# Patient Record
Sex: Female | Born: 2000 | Hispanic: No | Marital: Single | Smoking: Former smoker
Health system: Southern US, Community
[De-identification: ages and names within clinical notes are randomized; demographics above are authoritative.]

## PROBLEM LIST (undated history)

## (undated) ENCOUNTER — Ambulatory Visit

## (undated) DIAGNOSIS — Z3402 Encounter for supervision of normal first pregnancy, second trimester: Secondary | ICD-10-CM

## (undated) DIAGNOSIS — F419 Anxiety disorder, unspecified: Secondary | ICD-10-CM

---
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
## 2019-01-05 ENCOUNTER — Ambulatory Visit (INDEPENDENT_AMBULATORY_CARE_PROVIDER_SITE_OTHER): Payer: Medicaid Other | Admitting: Obstetrics

## 2019-01-05 ENCOUNTER — Encounter: Payer: Self-pay | Admitting: Obstetrics

## 2019-01-05 DIAGNOSIS — Z3A34 34 weeks gestation of pregnancy: Secondary | ICD-10-CM

## 2019-01-05 DIAGNOSIS — Z3480 Encounter for supervision of other normal pregnancy, unspecified trimester: Secondary | ICD-10-CM

## 2019-01-05 DIAGNOSIS — Z3483 Encounter for supervision of other normal pregnancy, third trimester: Secondary | ICD-10-CM

## 2019-01-05 NOTE — Progress Notes (Signed)
   TELEHEALTH OBSTETRICS PRENATAL VIRTUAL VIDEO VISIT ENCOUNTER NOTE  Provider location: [facility] [place] [facility] [place]   I connected with Rickey Primus on 01/05/19 at 11:15 AM EDT by WebEx OB MyChart Video Encounter at home and verified that I am speaking with the correct person using two identifiers.   I discussed the limitations, risks, security and privacy concerns of performing an evaluation and management service by telephone and the availability of in person appointments. I also discussed with the patient that there may be a patient responsible charge related to this service. The patient expressed understanding and agreed to proceed. Subjective:  Charlotte Ward is a 18 y.o. G1P0 at [redacted]w[redacted]d being seen today for ongoing prenatal care.  She is currently monitored for the following issues for this low-risk pregnancy and has Supervision of normal first teen pregnancy in second trimester on their problem list.  Patient reports heartburn.  Contractions: Irritability. Vag. Bleeding: None.  Movement: Present. Denies any leaking of fluid.   The following portions of the patient's history were reviewed and updated as appropriate: allergies, current medications, past family history, past medical history, past social history, past surgical history and problem list.   Objective:  There were no vitals filed for this visit.  Fetal Status:     Movement: Present     General:  Alert, oriented and cooperative. Patient is in no acute distress.  Respiratory: Normal respiratory effort, no problems with respiration noted  Mental Status: Normal mood and affect. Normal behavior. Normal judgment and thought content.  Rest of physical exam deferred due to type of encounter  Imaging: No results found.  Assessment and Plan:  Pregnancy: G1P0 at [redacted]w[redacted]d 1. Supervision of other normal pregnancy, antepartum   Preterm labor symptoms and general obstetric precautions including but not limited to vaginal  bleeding, contractions, leaking of fluid and fetal movement were reviewed in detail with the patient. I discussed the assessment and treatment plan with the patient. The patient was provided an opportunity to ask questions and all were answered. The patient agreed with the plan and demonstrated an understanding of the instructions. The patient was advised to call back or seek an in-person office evaluation/go to [facility] at [facility] for any urgent or concerning symptoms. Please refer to After Visit Summary for other counseling recommendations.   I provided 10 minutes of face-to-face time during this encounter.  Return in about 2 weeks (around 01/19/2019) for [place] in office.  No future appointments.  Baltazar Najjar, MD [facility] for [facility] [place] [facility] 01-05-2019

## 2019-01-05 NOTE — Progress Notes (Signed)
CC: None  

## 2019-01-06 ENCOUNTER — Encounter: Payer: Medicaid Other | Admitting: Obstetrics

## 2019-01-19 ENCOUNTER — Other Ambulatory Visit (HOSPITAL_COMMUNITY)
Admission: RE | Admit: 2019-01-19 | Discharge: 2019-01-19 | Disposition: A | Discharge status: Home / Self Care | Payer: Medicaid Other | Source: Ambulatory Visit | Attending: Certified Nurse Midwife | Admitting: Certified Nurse Midwife

## 2019-01-19 ENCOUNTER — Other Ambulatory Visit: Payer: Self-pay

## 2019-01-19 ENCOUNTER — Encounter: Payer: Self-pay | Admitting: Certified Nurse Midwife

## 2019-01-19 ENCOUNTER — Ambulatory Visit (INDEPENDENT_AMBULATORY_CARE_PROVIDER_SITE_OTHER): Payer: Medicaid Other | Admitting: Certified Nurse Midwife

## 2019-01-19 VITALS — BP 113/69 | HR 76 | Wt 150.0 lb

## 2019-01-19 DIAGNOSIS — Z3A36 36 weeks gestation of pregnancy: Secondary | ICD-10-CM

## 2019-01-19 DIAGNOSIS — Z3403 Encounter for supervision of normal first pregnancy, third trimester: Secondary | ICD-10-CM

## 2019-01-19 DIAGNOSIS — Z3480 Encounter for supervision of other normal pregnancy, unspecified trimester: Secondary | ICD-10-CM | POA: Insufficient documentation

## 2019-01-19 DIAGNOSIS — Z3402 Encounter for supervision of normal first pregnancy, second trimester: Secondary | ICD-10-CM

## 2019-01-19 NOTE — Progress Notes (Signed)
   PRENATAL VISIT NOTE  Subjective:  Charlotte Ward is a 18 y.o. G1P0 at [redacted]w[redacted]d being seen today for ongoing prenatal care.  She is currently monitored for the following issues for this low-risk pregnancy and has Supervision of normal first teen pregnancy in second trimester on their problem list.  Patient reports vaginal discharge.  Contractions: Irritability. Vag. Bleeding: None.  Movement: Present. Denies leaking of fluid.   The following portions of the patient's history were reviewed and updated as appropriate: allergies, current medications, past family history, past medical history, past social history, past surgical history and problem list.   Objective:   Vitals:   01/19/19 1109  BP: 113/69  Pulse: 76  Weight: 150 lb (68 kg)    Fetal Status: Fetal Heart Rate (bpm): 140 Fundal Height: 32 cm Movement: Present  Presentation: Vertex  General:  Alert, oriented and cooperative. Patient is in no acute distress.  Skin: Skin is warm and dry. No rash noted.   Cardiovascular: Normal heart rate noted  Respiratory: Normal respiratory effort, no problems with respiration noted  Abdomen: Soft, gravid, appropriate for gestational age.  Pain/Pressure: Absent     Pelvic: Cervical exam performed Dilation: 1 Effacement (%): 50 Station: -2  Extremities: Normal range of motion.  Edema: Trace  Mental Status: Normal mood and affect. Normal behavior. Normal judgment and thought content.   Assessment and Plan:  Pregnancy: G1P0 at [redacted]w[redacted]d 1. Supervision of normal first teen pregnancy in second trimester - Patient reports vaginal discharge has been present for the past week, describes discharge as white thick without odor, denies itching or irritation, swabs obtained will manage accordingly  - Routine prenatal care - Labor precautions and reasons to go to MAU reviewed  - Anticipatory guidance on upcoming appointments with next being MyChart visit then in person at 39 weeks, patient verbalizes  understanding  - Strep Gp B NAA - Cervicovaginal ancillary only( [place])  Preterm labor symptoms and general obstetric precautions including but not limited to vaginal bleeding, contractions, leaking of fluid and fetal movement were reviewed in detail with the patient. Please refer to After Visit Summary for other counseling recommendations.   Return in about 2 weeks (around 02/02/2019) for ROB-mychart.  Future Appointments  Date Time Provider [place]  02/02/2019  3:15 PM Glenice Bow, DO [place] None    Charlotte Ward, [place]

## 2019-01-19 NOTE — Progress Notes (Signed)
CC: Vaginal discharge.

## 2019-01-19 NOTE — Patient Instructions (Signed)
Reasons to go to MAU:  1.  Contractions are  5 minutes apart or less, each last 1 minute, these have been going on for 1-2 hours, and you cannot walk or talk during them 2.  You have a large gush of fluid, or a trickle of fluid that will not stop and you have to wear a pad 3.  You have bleeding that is bright red, heavier than spotting--like menstrual bleeding (spotting can be normal in early labor or after a check of your cervix) 4.  You do not feel the baby moving like he/she normally does   Third Trimester of Pregnancy  The third trimester is from week 28 through week 40 (months 7 through 9). This trimester is when your unborn baby (fetus) is growing very fast. At the end of the ninth month, the unborn baby is about 20 inches in length. It weighs about 6-10 pounds. Follow these instructions at home: Medicines  Take over-the-counter and prescription medicines only as told by your doctor. Some medicines are safe and some medicines are not safe during pregnancy.  Take a prenatal vitamin that contains at least 600 micrograms (mcg) of folic acid.  If you have trouble pooping (constipation), take medicine that will make your stool soft (stool softener) if your doctor approves. Eating and drinking   Eat regular, healthy meals.  Avoid raw meat and uncooked cheese.  If you get low calcium from the food you eat, talk to your doctor about taking a daily calcium supplement.  Eat four or five small meals rather than three large meals a day.  Avoid foods that are high in fat and sugars, such as fried and sweet foods.  To prevent constipation: ? Eat foods that are high in fiber, like fresh fruits and vegetables, whole grains, and beans. ? Drink enough fluids to keep your pee (urine) clear or pale yellow. Activity  Exercise only as told by your doctor. Stop exercising if you start to have cramps.  Avoid heavy lifting, wear low heels, and sit up straight.  Do not exercise if it is too hot,  too humid, or if you are in a place of great height (high altitude).  You may continue to have sex unless your doctor tells you not to. Relieving pain and discomfort  Wear a good support bra if your breasts are tender.  Take frequent breaks and rest with your legs raised if you have leg cramps or low back pain.  Take warm water baths (sitz baths) to soothe pain or discomfort caused by hemorrhoids. Use hemorrhoid cream if your doctor approves.  If you develop puffy, bulging veins (varicose veins) in your legs: ? Wear support hose or compression stockings as told by your doctor. ? Raise (elevate) your feet for 15 minutes, 3-4 times a day. ? Limit salt in your food. Safety  Wear your seat belt when driving.  Make a list of emergency phone numbers, including numbers for family, friends, the hospital, and police and fire departments. Preparing for your baby's arrival To prepare for the arrival of your baby:  Take prenatal classes.  Practice driving to the hospital.  Visit the hospital and tour the maternity area.  Talk to your work about taking leave once the baby comes.  Pack your hospital bag.  Prepare the baby's room.  Go to your doctor visits.  Buy a rear-facing car seat. Learn how to install it in your car. General instructions  Do not use hot tubs, steam rooms, or  saunas.  Do not use any products that contain nicotine or tobacco, such as cigarettes and e-cigarettes. If you need help quitting, ask your doctor.  Do not drink alcohol.  Do not douche or use tampons or scented sanitary pads.  Do not cross your legs for long periods of time.  Do not travel for long distances unless you must. Only do so if your doctor says it is okay.  Visit your dentist if you have not gone during your pregnancy. Use a soft toothbrush to brush your teeth. Be gentle when you floss.  Avoid cat litter boxes and soil used by cats. These carry germs that can cause birth defects in the baby  and can cause a loss of your baby (miscarriage) or stillbirth.  Keep all your prenatal visits as told by your doctor. This is important. Contact a doctor if:  You are not sure if you are in labor or if your water has broken.  You are dizzy.  You have mild cramps or pressure in your lower belly.  You have a nagging pain in your belly area.  You continue to feel sick to your stomach, you throw up, or you have watery poop.  You have bad smelling fluid coming from your vagina.  You have pain when you pee. Get help right away if:  You have a fever.  You are leaking fluid from your vagina.  You are spotting or bleeding from your vagina.  You have severe belly cramps or pain.  You lose or gain weight quickly.  You have trouble catching your breath and have chest pain.  You notice sudden or extreme puffiness (swelling) of your face, hands, ankles, feet, or legs.  You have not felt the baby move in over an hour.  You have severe headaches that do not go away with medicine.  You have trouble seeing.  You are leaking, or you are having a gush of fluid, from your vagina before you are 37 weeks.  You have regular belly spasms (contractions) before you are 37 weeks. Summary  The third trimester is from week 28 through week 40 (months 7 through 9). This time is when your unborn baby is growing very fast.  Follow your doctor's advice about medicine, food, and activity.  Get ready for the arrival of your baby by taking prenatal classes, getting all the baby items ready, preparing the baby's room, and visiting your doctor to be checked.  Get help right away if you are bleeding from your vagina, or you have chest pain and trouble catching your breath, or if you have not felt your baby move in over an hour. This information is not intended to replace advice given to you by your health care provider. Make sure you discuss any questions you have with your health care provider. Document  Released: 10/07/2009 Document Revised: 08/18/2016 Document Reviewed: 08/18/2016 Elsevier Interactive Patient Education  2019 [employer].

## 2019-01-20 LAB — CERVICOVAGINAL ANCILLARY ONLY
Bacterial vaginitis: NEGATIVE
Candida vaginitis: POSITIVE — AB
Chlamydia: NEGATIVE
Neisseria Gonorrhea: NEGATIVE
Trichomonas: NEGATIVE

## 2019-01-21 ENCOUNTER — Other Ambulatory Visit: Payer: Self-pay

## 2019-01-21 ENCOUNTER — Encounter (HOSPITAL_COMMUNITY): Payer: Self-pay | Admitting: *Deleted

## 2019-01-21 ENCOUNTER — Inpatient Hospital Stay (HOSPITAL_COMMUNITY)
Admission: AD | Admit: 2019-01-21 | Discharge: 2019-01-23 | DRG: 807 | Disposition: A | Discharge status: Home / Self Care | Payer: 59 | Attending: Family Medicine | Admitting: Family Medicine

## 2019-01-21 DIAGNOSIS — O4290 Premature rupture of membranes, unspecified as to length of time between rupture and onset of labor, unspecified weeks of gestation: Secondary | ICD-10-CM | POA: Diagnosis present

## 2019-01-21 DIAGNOSIS — Z3A36 36 weeks gestation of pregnancy: Secondary | ICD-10-CM | POA: Diagnosis not present

## 2019-01-21 DIAGNOSIS — Z3402 Encounter for supervision of normal first pregnancy, second trimester: Secondary | ICD-10-CM

## 2019-01-21 DIAGNOSIS — O42913 Preterm premature rupture of membranes, unspecified as to length of time between rupture and onset of labor, third trimester: Secondary | ICD-10-CM | POA: Diagnosis present

## 2019-01-21 DIAGNOSIS — Z1159 Encounter for screening for other viral diseases: Secondary | ICD-10-CM

## 2019-01-21 DIAGNOSIS — O09299 Supervision of pregnancy with other poor reproductive or obstetric history, unspecified trimester: Secondary | ICD-10-CM | POA: Diagnosis present

## 2019-01-21 DIAGNOSIS — O42013 Preterm premature rupture of membranes, onset of labor within 24 hours of rupture, third trimester: Secondary | ICD-10-CM | POA: Diagnosis not present

## 2019-01-21 LAB — CBC
HCT: 35.5 % — ABNORMAL LOW (ref 36.0–46.0)
Hemoglobin: 12.1 g/dL (ref 12.0–15.0)
MCH: 32.2 pg (ref 26.0–34.0)
MCHC: 34.1 g/dL (ref 30.0–36.0)
MCV: 94.4 fL (ref 80.0–100.0)
Platelets: 349 10*3/uL (ref 150–400)
RBC: 3.76 MIL/uL — ABNORMAL LOW (ref 3.87–5.11)
RDW: 12.8 % (ref 11.5–15.5)
WBC: 10.5 10*3/uL (ref 4.0–10.5)
nRBC: 0 % (ref 0.0–0.2)

## 2019-01-21 LAB — POCT FERN TEST: POCT Fern Test: POSITIVE

## 2019-01-21 LAB — TYPE AND SCREEN
ABO/RH(D): O POS
Antibody Screen: NEGATIVE

## 2019-01-21 LAB — SARS CORONAVIRUS 2 BY RT PCR (HOSPITAL ORDER, PERFORMED IN ~~LOC~~ HOSPITAL LAB): SARS Coronavirus 2: NEGATIVE

## 2019-01-21 LAB — STREP GP B NAA: Strep Gp B NAA: NEGATIVE

## 2019-01-21 MED ORDER — FENTANYL CITRATE (PF) 100 MCG/2ML IJ SOLN
100.0000 ug | INTRAMUSCULAR | Status: DC | PRN
  Administered 2019-01-22: 02:00:00 100 ug via INTRAVENOUS
  Filled 2019-01-21: qty 2

## 2019-01-21 MED ORDER — MISOPROSTOL 50MCG HALF TABLET
ORAL_TABLET | ORAL | Status: AC
  Filled 2019-01-21: qty 1

## 2019-01-21 MED ORDER — LACTATED RINGERS IV SOLN
INTRAVENOUS | Status: DC
  Administered 2019-01-21 (×2): via INTRAVENOUS

## 2019-01-21 MED ORDER — LIDOCAINE HCL (PF) 1 % IJ SOLN
30.0000 mL | INTRAMUSCULAR | Status: DC | PRN
  Filled 2019-01-21: qty 30

## 2019-01-21 MED ORDER — OXYTOCIN BOLUS FROM INFUSION: 500.0000 mL | Freq: Once | INTRAVENOUS | Status: DC

## 2019-01-21 MED ORDER — OXYCODONE-ACETAMINOPHEN 5-325 MG PO TABS: 2.0000 | ORAL_TABLET | ORAL | Status: DC | PRN

## 2019-01-21 MED ORDER — OXYCODONE-ACETAMINOPHEN 5-325 MG PO TABS: 1.0000 | ORAL_TABLET | ORAL | Status: DC | PRN

## 2019-01-21 MED ORDER — MISOPROSTOL 50MCG HALF TABLET
50.0000 ug | ORAL_TABLET | ORAL | Status: DC
  Administered 2019-01-21: 17:00:00 50 ug via ORAL

## 2019-01-21 MED ORDER — SOD CITRATE-CITRIC ACID 500-334 MG/5ML PO SOLN: 30.0000 mL | ORAL | Status: DC | PRN

## 2019-01-21 MED ORDER — FLEET ENEMA 7-19 GM/118ML RE ENEM: 1.0000 | ENEMA | RECTAL | Status: DC | PRN

## 2019-01-21 MED ORDER — ACETAMINOPHEN 325 MG PO TABS: 650.0000 mg | ORAL_TABLET | ORAL | Status: DC | PRN

## 2019-01-21 MED ORDER — LACTATED RINGERS IV SOLN: 500.0000 mL | INTRAVENOUS | Status: DC | PRN

## 2019-01-21 MED ORDER — ONDANSETRON HCL 4 MG/2ML IJ SOLN: 4.0000 mg | Freq: Four times a day (QID) | INTRAMUSCULAR | Status: DC | PRN

## 2019-01-21 MED ORDER — OXYTOCIN 40 UNITS IN NORMAL SALINE INFUSION - SIMPLE MED
2.5000 [IU]/h | INTRAVENOUS | Status: DC
  Filled 2019-01-21: qty 1000

## 2019-01-21 NOTE — Progress Notes (Signed)
Vtx confirmed by bedside US 

## 2019-01-21 NOTE — MAU Note (Signed)
Charlotte Ward is a 18 y.o. at [redacted]w[redacted]d here in MAU reporting: states this morning after she used the restroom, water started coming out and wouldn't stop. States she is still feeling the leaking, had some paper towels in her panties. Fluid was clear. No pain, no bleeding. +FM  Onset of complaint: an hour ago  Pain score: 0/10  Vitals:   01/21/19 1122  BP: 117/77  Pulse: 74  Resp: 16  Temp: 98.2 F (36.8 C)  SpO2: 97%     FHT: +FM  Lab orders placed from triage: none

## 2019-01-21 NOTE — Progress Notes (Signed)
[facility]: Labor Progress Note  Subjective:  Patient doing well.  She reports strengthening contractions that she is just starting to feel.  Objective:  BP 139/72   Pulse 67   Temp 100 F (37.8 C) (Oral)   Resp 18   Ht 5\' 7"  (1.702 m)   Wt 68.7 kg   LMP 05/09/2018   SpO2 97%   BMI 23.72 kg/m  Gen: Lying in bed comfortably. NAD.  Extremities: No signs of DVT.   CE: Dilation: 1 Effacement (%): 50 Station: -2 Presentation: Vertex Exam by:: Dr. Maudie Mercury (resident) Contractions: regular q32minutes FH: BL 130, mod var, + a, -d.   Assessment and Plan:  Charlotte Ward is a 18 y.o. G1P0 at [redacted]w[redacted]d - PPROM  Labor: No cervical change but contractions strengthening. Placed FB successfully. No cytotec.  . Pain control: IV pain meds and contemplating epidural . Anticipated MOD: NSVD . PPH Risk: low   Fetal Wellbeing: Cat I tracing . GBS Negative (06/25 1144)  . Continuous fetal monitoring  Zettie Cooley, M.D.  Family Medicine  PGY-1 01/21/2019 10:12 PM

## 2019-01-21 NOTE — Progress Notes (Signed)
Labor Progress Note Jahzaria Vary is a 18 y.o. G1P0 at [redacted]w[redacted]d presented for PPROM  S:  Patient states cramping is the same as before. No worse.   O:  BP 124/70   Pulse 70   Temp 98.2 F (36.8 C) (Oral)   Resp 16   Ht 5\' 7"  (1.702 m)   Wt 68.7 kg   LMP 05/09/2018   SpO2 97%   BMI 23.72 kg/m   Fetal Tracing:  Baseline: 135 Variability: moderate Accels: 15x15 Decels: none  Toco: ui   CVE: Dilation: 1 Effacement (%): 50 Station: -2 Presentation: Vertex Exam by:: Len Blalock, CNM   A&P: 18 y.o. G1P0 [redacted]w[redacted]d PPROM #Labor: No cervical change and no worsening contractions. Discussed with patient cytotec with FB for augmentation. Patient declines FB at this time but agreeable to try cytotec #Pain: n/a #FWB: Cat 1 #GBS negative  Wende Mott, CNM 4:58 PM

## 2019-01-21 NOTE — H&P (Addendum)
OBSTETRIC ADMISSION HISTORY AND PHYSICAL  Charlotte Ward is a 18 y.o. female G1P0 with IUP at 4056w5d by LMP presenting for PPROM. She reports a gush of clear fluid at 1000 that has continued to leak. She reports +FMs, no VB, no blurry vision, headaches or peripheral edema, and RUQ pain.  She plans on breast feeding. She request PostPlacental IUD for birth control. [facility]   Dating: By LMP --->  Estimated Date of Delivery: 02/13/19   Nursing Staff Provider  Office Location  [facility] Dating   LMP: 05/09/2018  Language   English Anatomy US  Wnl  Flu Vaccine   09/26/2018 Genetic Screen  NIPS: low risk female   AFP:    TDaP vaccine   info given 11-21-18 Hgb A1C or  GTT Early  Third trimester : 2 hour wnl Component     Latest Ref Rng & Units 11/21/2018  Glucose, Fasting     65 - 91 mg/dL 71  Glucose, 1 hour     65 - 179 mg/dL 98  Glucose, 2 hour     65 - 152 mg/dL 74    Rhogam     LAB RESULTS   Feeding Plan  Breast Blood Type   O positive  Contraception  IUD Antibody  negative  Circumcision  Yes Rubella  immune  Pediatrician   [facility] [place] RPR   neg  Support Person  [facility] PagesBobby King - FOB HBsAg   neg  Prenatal Classes  Yes HIV    BTL Consent  GBS  (For PCN allergy, check sensitivities)   VBAC Consent  Pap n/a    Hgb Electro      CF     SMA     Waterbirth  [ ]  Class [ ]  Consent [ ]  CNM visit      Prenatal History/Complications:  Past Medical History: Past Medical History:  Diagnosis Date  . Anxiety     Past Surgical History: History reviewed. No pertinent surgical history.  Obstetrical History: OB History    Gravida  1   Para      Term      Preterm      AB      Living        SAB      TAB      Ectopic      Multiple      Live Births              Social History: Social History   Socioeconomic History  . Marital status: Single    Spouse name: Not on file  . Number of children: Not on file  . Years of  education: Not on file  . Highest education level: Not on file  Occupational History  . Not on file  Social Needs  . Financial resource strain: Not on file  . Food insecurity    Worry: Not on file    Inability: Not on file  . Transportation needs    Medical: Not on file    Non-medical: Not on file  Tobacco Use  . Smoking status: Former Smoker    Packs/day: 0.25    Types: Cigars    Quit date: 10/08/2016    Years since quitting: 2.2  . Smokeless tobacco: Never Used  Substance and Sexual Activity  . Alcohol use: No  . Drug use: No  . Sexual activity: Yes    Birth control/protection: None  Lifestyle  .  Physical activity    Days per week: Not on file    Minutes per session: Not on file  . Stress: Not on file  Relationships  . Social Herbalist on phone: Not on file    Gets together: Not on file    Attends religious service: Not on file    Active member of club or organization: Not on file    Attends meetings of clubs or organizations: Not on file    Relationship status: Not on file  Other Topics Concern  . Not on file  Social History Narrative  . Not on file    Family History: Family History  Problem Relation Age of Onset  . Asthma Mother   . Migraines Mother     Allergies: Allergies  Allergen Reactions  . Tomato Swelling    Patient reports only allergic to RAW tomatoes.    Medications Prior to Admission  Medication Sig Dispense Refill Last Dose  . Prenatal Vit-Fe Fumarate-FA (PRENATAL VITAMINS PO) Take by mouth.   01/20/2019 at Unknown time  . triamcinolone ointment (KENALOG) 0.5 % Apply 1 application topically 2 (two) times daily. 30 g 0      Review of Systems   All systems reviewed and negative except as stated in HPI  Blood pressure 113/66, pulse 82, temperature 98.2 F (36.8 C), temperature source Oral, resp. rate 16, height 5\' 7"  (1.702 m), weight 68.7 kg, last menstrual period 05/09/2018, SpO2 97 %. General appearance: alert, cooperative  and no distress Lungs: clear to auscultation bilaterally Heart: regular rate and rhythm Abdomen: soft, non-tender; bowel sounds normal Pelvic: n/a Extremities: Homans sign is negative, no sign of DVT DTR's +2 Presentation: cephalic Fetal monitoringBaseline: 140 bpm, Variability: Good {> 6 bpm), Accelerations: Reactive and Decelerations: Absent Uterine activity: UI     Prenatal labs: ABO, Rh: O/Positive/-- (03/02 1438) Antibody: Negative (03/02 1438) Rubella: 13.70 (03/02 1438) RPR: Non Reactive (04/27 1059)  HBsAg: Negative (03/02 1438)  HIV: Non Reactive (04/27 1059)  GBS: Negative (06/25 1144)   Prenatal Transfer Tool  Maternal Diabetes: No Genetic Screening: Normal Maternal Ultrasounds/Referrals: Normal Fetal Ultrasounds or other Referrals:  None Maternal Substance Abuse:  No Significant Maternal Medications:  None Significant Maternal Lab Results: Group B Strep negative  Results for orders placed or performed during the hospital encounter of 01/21/19 (from the past 24 hour(s))  POCT fern test   Collection Time: 01/21/19 12:06 PM  Result Value Ref Range   POCT Fern Test Positive = ruptured amniotic membanes     Patient Active Problem List   Diagnosis Date Noted  . Supervision of normal first teen pregnancy in second trimester 09/26/2018    Assessment/Plan:  Charlotte Ward is a 18 y.o. G1P0 at [redacted]w[redacted]d here for preterm premature rupture of membranes  #Labor: patient desires expectant management. Will recheck around 5pm.  #Pain: Per patient request #FWB: Cat 1 #ID:  GBS neg #MOF: Breast #MOC: now unsure about postplacental IUD, considering and will inform staff of plan #Circ:  [place], CNM  01/21/2019, 12:22 PM

## 2019-01-22 ENCOUNTER — Encounter (HOSPITAL_COMMUNITY): Payer: Self-pay

## 2019-01-22 DIAGNOSIS — O42013 Preterm premature rupture of membranes, onset of labor within 24 hours of rupture, third trimester: Secondary | ICD-10-CM

## 2019-01-22 DIAGNOSIS — Z3A36 36 weeks gestation of pregnancy: Secondary | ICD-10-CM

## 2019-01-22 LAB — CBC
HCT: 34.4 % — ABNORMAL LOW (ref 36.0–46.0)
Hemoglobin: 11.7 g/dL — ABNORMAL LOW (ref 12.0–15.0)
MCH: 32 pg (ref 26.0–34.0)
MCHC: 34 g/dL (ref 30.0–36.0)
MCV: 94 fL (ref 80.0–100.0)
Platelets: 334 10*3/uL (ref 150–400)
RBC: 3.66 MIL/uL — ABNORMAL LOW (ref 3.87–5.11)
RDW: 12.5 % (ref 11.5–15.5)
WBC: 19.4 10*3/uL — ABNORMAL HIGH (ref 4.0–10.5)
nRBC: 0 % (ref 0.0–0.2)

## 2019-01-22 LAB — RPR: RPR Ser Ql: NONREACTIVE

## 2019-01-22 MED ORDER — BENZOCAINE-MENTHOL 20-0.5 % EX AERO
1.0000 "application " | INHALATION_SPRAY | CUTANEOUS | Status: DC | PRN
  Administered 2019-01-22: 1 via TOPICAL
  Filled 2019-01-22: qty 56

## 2019-01-22 MED ORDER — SENNOSIDES-DOCUSATE SODIUM 8.6-50 MG PO TABS
2.0000 | ORAL_TABLET | ORAL | Status: DC
  Administered 2019-01-23: 2 via ORAL
  Filled 2019-01-22: qty 2

## 2019-01-22 MED ORDER — DIBUCAINE (PERIANAL) 1 % EX OINT: 1.0000 "application " | TOPICAL_OINTMENT | CUTANEOUS | Status: DC | PRN

## 2019-01-22 MED ORDER — ONDANSETRON HCL 4 MG PO TABS: 4.0000 mg | ORAL_TABLET | ORAL | Status: DC | PRN

## 2019-01-22 MED ORDER — WITCH HAZEL-GLYCERIN EX PADS: 1.0000 "application " | MEDICATED_PAD | CUTANEOUS | Status: DC | PRN

## 2019-01-22 MED ORDER — DIPHENHYDRAMINE HCL 25 MG PO CAPS: 25.0000 mg | ORAL_CAPSULE | Freq: Four times a day (QID) | ORAL | Status: DC | PRN

## 2019-01-22 MED ORDER — IBUPROFEN 600 MG PO TABS
600.0000 mg | ORAL_TABLET | Freq: Four times a day (QID) | ORAL | Status: DC
  Administered 2019-01-22 – 2019-01-23 (×6): 600 mg via ORAL
  Filled 2019-01-22 (×6): qty 1

## 2019-01-22 MED ORDER — DOCUSATE SODIUM 100 MG PO CAPS
100.0000 mg | ORAL_CAPSULE | Freq: Two times a day (BID) | ORAL | Status: DC
  Administered 2019-01-23 (×2): 100 mg via ORAL
  Filled 2019-01-22 (×2): qty 1

## 2019-01-22 MED ORDER — OXYTOCIN 10 UNIT/ML IJ SOLN
INTRAMUSCULAR | Status: AC
  Administered 2019-01-22: 10 [IU]
  Filled 2019-01-22: qty 1

## 2019-01-22 MED ORDER — SIMETHICONE 80 MG PO CHEW: 80.0000 mg | CHEWABLE_TABLET | ORAL | Status: DC | PRN

## 2019-01-22 MED ORDER — ACETAMINOPHEN 325 MG PO TABS
650.0000 mg | ORAL_TABLET | ORAL | Status: DC | PRN
  Administered 2019-01-22 (×2): 650 mg via ORAL
  Filled 2019-01-22 (×2): qty 2

## 2019-01-22 MED ORDER — COCONUT OIL OIL: 1.0000 "application " | TOPICAL_OIL | Status: DC | PRN

## 2019-01-22 MED ORDER — ONDANSETRON HCL 4 MG/2ML IJ SOLN: 4.0000 mg | INTRAMUSCULAR | Status: DC | PRN

## 2019-01-22 MED ORDER — PRENATAL MULTIVITAMIN CH
1.0000 | ORAL_TABLET | Freq: Every day | ORAL | Status: DC
  Administered 2019-01-23: 12:00:00 1 via ORAL
  Filled 2019-01-22: qty 1

## 2019-01-22 NOTE — Progress Notes (Signed)
CSW received consult for hx of Anxiety.  CSW met with MOB to offer support and complete assessment.    CSW met with MOB at bedside to discuss consult for history of anxiety. MOB was sitting up in bed and infant was asleep in basinet. CSW introduced self and explained reason for consult. MOB was welcoming and engaged during assessment. CSW and MOB discussed MOB's mental health history. MOB reported that she was diagnosed with anxiety in middle school and the last time she experienced symptoms was one month ago. MOB reported "nothing serious" and endorsed feelings of being overwhelmed about getting ready for infant' birth. CSW acknowledged and normalized MOB's feelings. MOB denied any current symptoms of anxiety. MOB denied any other mental health history. CSW inquired about MOB's support system, MOB reported that her boyfriend, mom, mom in law and her grandma were her supports. MOB presented calm and did not demonstrate any acute mental health signs/symptoms. CSW assessed for safety, MOB denied SI, HI and domestic violence.   CSW provided education regarding the baby blues period vs. perinatal mood disorders, discussed treatment and gave resources for mental health follow up if concerns arise.  CSW recommends self-evaluation during the postpartum time period using the New Mom Checklist from Postpartum Progress and encouraged MOB to contact a medical professional if symptoms are noted at any time.    CSW provided review of Sudden Infant Death Syndrome (SIDS) precautions. MOB verbalized understanding and reported that infant would sleep in basinet. MOB reported that she has all items needed to care for infant.    CSW identifies no further need for intervention and no barriers to discharge at this time.  Rishaan Gunner, LCSW Clinical Social Worker [facility] Cell#: [phone]   

## 2019-01-22 NOTE — Progress Notes (Signed)
Reinforced education on putting electric pump parts together.

## 2019-01-22 NOTE — Lactation Note (Signed)
This note was copied from a baby's chart. Lactation Consultation Note  Patient Name: Charlotte Ward INOMV'E Date: 01/22/2019 Reason for consult: Initial assessment;Primapara;1st time breastfeeding;Late-preterm 34-36.6wks Baby is 12 hours old , greater than 6 pounds , LPT .  Per mom has pumped and has EBM sitting at beside.  LC offered to check diaper, it was dry and woke baby up enough and spoon fed 2 ml and  Baby rooting and reviewed basics of latching with mom and baby latched well on the right breast / football  And fed for 8 mins and released on her own , nipple well rounded and per mom comfortable.  LC reviewed the potential feeding behaviors of a LPT infant.  LC reassured mom the baby and her are off to a great start with breast feeding , hand expressing and pumping.  LC stressed the importance of STS feedings every feeding until the baby is back to birth weight, gaining steadily and  Can stay awake for majority of feeding.  Per mom had several breast changes with pregnancy.  Active with WIC and took a breast feeding class on line with WIC .  Has a manual pump at home.  LC mentioned to mom since the baby is above 6 pounds she may or may not need a DEBP and the hand pump  May work just fine. A DEBP need can be reassessed in the next 48 hours.   LC provided the pamphlet for the Lactation services, virtual support group .  Mom receptive to breast feeding teaching and seemed very motivated.    Maternal Data Has patient been taught Hand Expression?: Yes Does the patient have breastfeeding experience prior to this delivery?: No  Feeding Feeding Type: Breast Milk  LATCH Score Latch: Grasps breast easily, tongue down, lips flanged, rhythmical sucking.  Audible Swallowing: Spontaneous and intermittent  Type of Nipple: Everted at rest and after stimulation  Comfort (Breast/Nipple): Soft / non-tender  Hold (Positioning): Assistance needed to correctly position infant at breast  and maintain latch.  LATCH Score: 9  Interventions Interventions: Breast feeding basics reviewed;Assisted with latch;Skin to skin;Breast massage;Hand express;Breast compression;Adjust position;Support pillows;Position options;Expressed milk;DEBP  Lactation Tools Discussed/Used Tools: Pump Breast pump type: Double-Electric Breast Pump WIC Program: Yes Pump Review: Milk Storage Initiated by:: Rosie Fate, RN Date initiated:: 01/22/19   Consult Status Consult Status: Follow-up Date: 01/23/19    Myer Haff 01/22/2019, 3:14 PM

## 2019-01-22 NOTE — Discharge Summary (Signed)
Obstetrics Discharge Summary [facility]   Patient Name: Charlotte Ward DOB: 31-May-2001 MRN: [id number]  Date of admission: 01/21/2019 Delivering MD: Zettie Cooley E   Date of discharge: 01/23/2019  Admitting diagnosis: PPROM Intrauterine pregnancy: [redacted]w[redacted]d     Secondary diagnosis:   Active Problems:   Premature rupture of membranes    Discharge diagnosis: Preterm Pregnancy Delivered                                            Postpartum procedures: None  Complications: none  Outpatient Follow-Up: [ ]  IUD placement at postpartum visit  Hospital course: Charlotte Ward is a 18 y.o. [redacted]w[redacted]d who was admitted for PPROM. Her pregnancy was otherwise uncomplicated. Her labor course was notable for augmentation with cytotec and FB then quickly progressed to complete. Delivery was uncomplicated. Please see delivery/op note for additional details. Her postpartum course was uncomplicated. She was breastfeeding without difficulty. By day of discharge, she was passing flatus, urinating, eating and drinking without difficulty. Her pain was well-controlled, and she was discharged home with ibuprofen. She will follow-up in clinic in 5 weeks.   Physical exam  Vitals:   01/22/19 1411 01/22/19 1712 01/22/19 2115 01/23/19 0456  BP: 120/73 110/70 120/78 (!) 102/54  Pulse: (!) 58 61 75 (!) 59  Resp: 16 16 16 18   Temp: 97.8 F (36.6 C) 98.8 F (37.1 C) 98.5 F (36.9 C) 98.2 F (36.8 C)  TempSrc: Oral Oral Oral   SpO2:   99% 100%  Weight:      Height:       General: appears well, ambulating without assistance Lochia: appropriate Uterine Fundus: firm Incision: N/A DVT Evaluation: No evidence of DVT seen on physical exam. No significant calf/ankle edema.  Labs: Lab Results  Component Value Date   WBC 19.4 (H) 01/22/2019   HGB 11.7 (L) 01/22/2019   HCT 34.4 (L) 01/22/2019   MCV 94.0 01/22/2019   PLT 334 01/22/2019   CMP Latest Ref Rng & Units 08/11/2018  Glucose 70 - 99 mg/dL 98  BUN 6 -  20 mg/dL 9  Creatinine 0.44 - 1.00 mg/dL 0.66  Sodium 135 - 145 mmol/L 136  Potassium 3.5 - 5.1 mmol/L 4.0  Chloride 98 - 111 mmol/L 107  CO2 22 - 32 mmol/L 19(L)  Calcium 8.9 - 10.3 mg/dL 8.9    Discharge instructions: Per After Visit Summary and "Baby and Me Booklet"  After visit meds:  Allergies as of 01/23/2019      Reactions   Tomato Swelling   Patient reports only allergic to RAW tomatoes.      Medication List    STOP taking these medications   triamcinolone ointment 0.5 % Commonly known as: KENALOG     TAKE these medications   docusate sodium 100 MG capsule Commonly known as: COLACE Take 1 capsule (100 mg total) by mouth 2 (two) times daily.   ibuprofen 600 MG tablet Commonly known as: ADVIL Take 1 tablet (600 mg total) by mouth every 6 (six) hours.   prenatal multivitamin Tabs tablet Take 1 tablet by mouth daily at 12 noon.   ROLAIDS PO Take 1 each by mouth daily as needed (For heartburn.).       Postpartum contraception: IUD Mirena Diet: Routine Diet Activity: Advance as tolerated. Pelvic rest for 6 weeks.   Follow-up Appt: Future Appointments  Date Time Provider  Department Center  02/23/2019  3:00 PM Sharyon Cableogers, Veronica C, CNM [facility] None  Please schedule this patient for Postpartum visit in: 4 weeks with the following provider: Any provider Low risk pregnancy complicated by: PPROM Delivery mode:  SVD Anticipated Birth Control:  IUD PP Procedures needed: IUD placement at postpartum visit  Schedule Integrated BH visit: no  Newborn Data: Live born female  Birth Weight:  3000 g APGAR: 8, 9   Newborn Delivery   Birth date/time: 01/22/2019 02:32:00 Delivery type: Vaginal, Spontaneous      Baby Feeding: Breast Disposition:home with mother     Judeth Ward, Jenesys Casseus, NP

## 2019-01-23 MED ORDER — IBUPROFEN 600 MG PO TABS: 600.0000 mg | ORAL_TABLET | Freq: Four times a day (QID) | ORAL | 0 refills | Status: DC

## 2019-01-23 MED ORDER — DOCUSATE SODIUM 100 MG PO CAPS: 100.0000 mg | ORAL_CAPSULE | Freq: Two times a day (BID) | ORAL | 0 refills | Status: DC

## 2019-01-23 NOTE — Discharge Instructions (Signed)
Vaginal Delivery, Care After °Refer to this sheet in the next few weeks. These instructions provide you with information about caring for yourself after vaginal delivery. Your health care provider may also give you more specific instructions. Your treatment has been planned according to current medical practices, but problems sometimes occur. Call your health care provider if you have any problems or questions. °What can I expect after the procedure? °After vaginal delivery, it is common to have: °· Some bleeding from your vagina. °· Soreness in your abdomen, your vagina, and the area of skin between your vaginal opening and your anus (perineum). °· Pelvic cramps. °· Fatigue. °Follow these instructions at home: °Medicines °· Take over-the-counter and prescription medicines only as told by your health care provider. °· If you were prescribed an antibiotic medicine, take it as told by your health care provider. Do not stop taking the antibiotic until it is finished. °Driving ° °· Do not drive or operate heavy machinery while taking prescription pain medicine. °· Do not drive for 24 hours if you received a sedative. °Lifestyle °· Do not drink alcohol. This is especially important if you are breastfeeding or taking medicine to relieve pain. °· Do not use tobacco products, including cigarettes, chewing tobacco, or e-cigarettes. If you need help quitting, ask your health care provider. °Eating and drinking °· Drink at least 8 eight-ounce glasses of water every day unless you are told not to by your health care provider. If you choose to breastfeed your baby, you may need to drink more water than this. °· Eat high-fiber foods every day. These foods may help prevent or relieve constipation. High-fiber foods include: °? Whole grain cereals and breads. °? Brown rice. °? Beans. °? Fresh fruits and vegetables. °Activity °· Return to your normal activities as told by your health care provider. Ask your health care provider what  activities are safe for you. °· Rest as much as possible. Try to rest or take a nap when your baby is sleeping. °· Do not lift anything that is heavier than your baby or 10 lb (4.5 kg) until your health care provider says that it is safe. °· Talk with your health care provider about when you can engage in sexual activity. This may depend on your: °? Risk of infection. °? Rate of healing. °? Comfort and desire to engage in sexual activity. °Vaginal Care °· If you have an episiotomy or a vaginal tear, check the area every day for signs of infection. Check for: °? More redness, swelling, or pain. °? More fluid or blood. °? Warmth. °? Pus or a bad smell. °· Do not use tampons or douches until your health care provider says this is safe. °· Watch for any blood clots that may pass from your vagina. These may look like clumps of dark red, brown, or black discharge. °General instructions °· Keep your perineum clean and dry as told by your health care provider. °· Wear loose, comfortable clothing. °· Wipe from front to back when you use the toilet. °· Ask your health care provider if you can shower or take a bath. If you had an episiotomy or a perineal tear during labor and delivery, your health care provider may tell you not to take baths for a certain length of time. °· Wear a bra that supports your breasts and fits you well. °· If possible, have someone help you with household activities and help care for your baby for at least a few days after you   leave the hospital. °· Keep all follow-up visits for you and your baby as told by your health care provider. This is important. °Contact a health care provider if: °· You have: °? Vaginal discharge that has a bad smell. °? Difficulty urinating. °? Pain when urinating. °? A sudden increase or decrease in the frequency of your bowel movements. °? More redness, swelling, or pain around your episiotomy or vaginal tear. °? More fluid or blood coming from your episiotomy or vaginal  tear. °? Pus or a bad smell coming from your episiotomy or vaginal tear. °? A fever. °? A rash. °? Little or no interest in activities you used to enjoy. °? Questions about caring for yourself or your baby. °· Your episiotomy or vaginal tear feels warm to the touch. °· Your episiotomy or vaginal tear is separating or does not appear to be healing. °· Your breasts are painful, hard, or turn red. °· You feel unusually sad or worried. °· You feel nauseous or you vomit. °· You pass large blood clots from your vagina. If you pass a blood clot from your vagina, save it to show to your health care provider. Do not flush blood clots down the toilet without having your health care provider look at them. °· You urinate more than usual. °· You are dizzy or light-headed. °· You have not breastfed at all and you have not had a menstrual period for 12 weeks after delivery. °· You have stopped breastfeeding and you have not had a menstrual period for 12 weeks after you stopped breastfeeding. °Get help right away if: °· You have: °? Pain that does not go away or does not get better with medicine. °? Chest pain. °? Difficulty breathing. °? Blurred vision or spots in your vision. °? Thoughts about hurting yourself or your baby. °· You develop pain in your abdomen or in one of your legs. °· You develop a severe headache. °· You faint. °· You bleed from your vagina so much that you fill two sanitary pads in one hour. °This information is not intended to replace advice given to you by your health care provider. Make sure you discuss any questions you have with your health care provider. °Document Released: 07/10/2000 Document Revised: 12/25/2015 Document Reviewed: 07/28/2015 °Elsevier Interactive Patient Education © 2019 Elsevier Inc. ° °

## 2019-01-23 NOTE — Lactation Note (Addendum)
This note was copied from a baby's chart. Lactation Consultation Note  Patient Name: Charlotte Ward JSEGB'T Date: 01/23/2019 Reason for consult: Primapara;1st time breastfeeding;Follow-up assessment;Late-preterm 34-36.6wks;Infant weight loss Baby is 68 hours old  LC reviewed and updated the doc flow sheets per mom  As LC entered the room mom starting to feed the baby EBM from a bottle ( green nipple ) .  Baby tolerated green nipple and ate 10 ml. Baby acting still hungry and LC assisted mom to  [place] in the cross cradle / depth obtained/ swallows/ and per mom comfortable.  Breast are filling and as a preventive measure shells are indicated.  [place] asked the RN caring for mom to provide on rounds.  [place] asked mom to call [facility] and ask for a DEBP for when she is D/C.  LC will send a referral to [place] for a DEBP for this mom today - sent .  Per mom feels good about the breast feeding and the [facility] plan.  [place] praised mom for her efforts breast feeding , and pumping.  Mom expressed appreciation for assistance.   Maternal Data Has patient been taught Hand Expression?: Yes Does the patient have breastfeeding experience prior to this delivery?: Yes  Feeding Feeding Type: Breast Fed  LATCH Score Latch: Grasps breast easily, tongue down, lips flanged, rhythmical sucking.  Audible Swallowing: A few with stimulation  Type of Nipple: Everted at rest and after stimulation  Comfort (Breast/Nipple): Soft / non-tender  Hold (Positioning): Assistance needed to correctly position infant at breast and maintain latch.  LATCH Score: 8  Interventions Interventions: Breast feeding basics reviewed;Assisted with latch;Skin to skin;Breast massage;Hand express;Breast compression;Adjust position;Support pillows;Position options  Lactation Tools Discussed/Used Tools: Pump(LC asked the RN to provide the shells for mom) Breast pump type: Double-Electric Breast Pump WIC Program: Yes(enc mom to call [place] and leave  a message for a DEBP - LC sent [facility] referral) Pump Review: (encouraged to continue to post pump - see LC note)   Consult Status Consult Status: Follow-up Date: 01/24/19 Follow-up type: In-patient    [place] 01/23/2019, 3:04 PM

## 2019-01-24 ENCOUNTER — Ambulatory Visit: Payer: Self-pay

## 2019-01-24 NOTE — Lactation Note (Signed)
This note was copied from a baby's chart. Lactation Consultation Note  Patient Name: Charlotte Ward Date: 01/24/2019   Baby 65 hours old.  [redacted]w[redacted]d. Mother is breastfeeding and pumping after every other feeding. She is now pumping approx 25 ml which she gave to baby at 0830 feeding. Mother will pick up [facility]BP from [facility] upon discharge. Reviewed milk storage and pumping frequency. Feed on demand approximately 8-12 times per day or at least q 3-4 hours..   Reviewed engorgement care and monitoring voids/stools.       Maternal Data    Feeding Feeding Type: Bottle Fed - Breast Milk Nipple Type: Slow - flow  LATCH Score                   Interventions    Lactation Tools Discussed/Used     Consult Status      Carlye Grippe 01/24/2019, 9:30 AM

## 2019-01-31 DIAGNOSIS — Z3480 Encounter for supervision of other normal pregnancy, unspecified trimester: Secondary | ICD-10-CM

## 2019-02-02 ENCOUNTER — Telehealth: Payer: Medicaid Other | Admitting: Family Medicine

## 2019-02-23 ENCOUNTER — Ambulatory Visit: Payer: 59 | Admitting: Certified Nurse Midwife

## 2019-03-01 ENCOUNTER — Other Ambulatory Visit: Payer: Self-pay

## 2019-03-01 ENCOUNTER — Ambulatory Visit: Payer: 59 | Admitting: Certified Nurse Midwife

## 2019-03-01 ENCOUNTER — Encounter: Payer: Self-pay | Admitting: Certified Nurse Midwife

## 2019-03-01 DIAGNOSIS — Z1389 Encounter for screening for other disorder: Secondary | ICD-10-CM

## 2019-03-01 DIAGNOSIS — Z3202 Encounter for pregnancy test, result negative: Secondary | ICD-10-CM | POA: Diagnosis not present

## 2019-03-01 DIAGNOSIS — Z30013 Encounter for initial prescription of injectable contraceptive: Secondary | ICD-10-CM

## 2019-03-01 LAB — POCT URINE PREGNANCY: Preg Test, Ur: NEGATIVE

## 2019-03-01 MED ORDER — MEDROXYPROGESTERONE ACETATE 150 MG/ML IM SUSP: 150.0000 mg | INTRAMUSCULAR | 3 refills | Status: DC

## 2019-03-01 MED ORDER — MEDROXYPROGESTERONE ACETATE 150 MG/ML IM SUSP
150.0000 mg | Freq: Once | INTRAMUSCULAR | Status: AC
  Administered 2019-03-01: 150 mg via INTRAMUSCULAR

## 2019-03-01 MED ORDER — FLUCONAZOLE 150 MG PO TABS: 150.0000 mg | ORAL_TABLET | Freq: Once | ORAL | 1 refills | Status: AC

## 2019-03-01 NOTE — Progress Notes (Signed)
Rx diflucan for yeast  

## 2019-03-01 NOTE — Patient Instructions (Signed)
Contraceptive Injection A contraceptive injection is a shot that prevents pregnancy. It is also called the birth control shot. The shot contains the hormone progestin, which prevents pregnancy by:  Stopping the ovaries from releasing eggs.  Thickening cervical mucus to prevent sperm from entering the cervix.  Thinning the lining of the uterus to prevent a fertilized egg from attaching to the uterus. Contraceptive injections are given under the skin (subcutaneous) or into a muscle (intramuscular). For these shots to work, you must get one of them every 3 months (12 weeks) from a health care provider. Tell a health care provider about:  Any allergies you have.  All medicines you are taking, including vitamins, herbs, eye drops, creams, and over-the-counter medicines.  Any blood disorders you have.  Any medical conditions you have.  Whether you are pregnant or may be pregnant. What are the risks? Generally, this is a safe procedure. However, problems may occur, including:  Mood changes or depression.  Loss of bone density (osteoporosis) after long-term use.  Blood clots.  Higher risk of an egg being fertilized outside your uterus (ectopic pregnancy).This is rare. What happens before the procedure?  Your health care provider may do a routine physical exam.  You may have a test to make sure you are not pregnant. What happens during the procedure?  The area where the shot will be given will be cleaned and sanitized with alcohol.  A needle will be inserted into a muscle in your upper arm or buttock, or into the skin of your thigh or abdomen. The needle will be attached to a syringe with the medicine inside of it.  The medicine will be pushed through the syringe and injected into your body.  A small bandage (dressing) may be placed over the injection site. What can I expect after the procedure?  After the procedure, it is common to have: ? Soreness around the injection site for  a couple of days. ? Irregular menstrual bleeding. ? Weight gain. ? Breast tenderness. ? Headaches. ? Discomfort in your abdomen.  Ask your health care provider whether you need to use an added method of birth control (backup contraception), such as a condom, sponge, or spermicide. ? If the first shot is given 1-7 days after the start of your last period, you will not need backup contraception. ? If the first shot is given at any other time during your menstrual cycle, you should avoid having sex or you will need backup contraception for 7 days after you receive the shot. Follow these instructions at home: General instructions   Take over-the-counter and prescription medicines only as told by your health care provider.  Do not massage the injection site.  Track your menstrual periods so you will know if they become irregular.  Always use a condom to protect against STIs (sexually transmitted infections).  Make sure you schedule an appointment in time for your next shot, and mark it on your calendar. For the birth control to prevent pregnancy, you must get the injections every 3 months (12 weeks). Lifestyle  Do not use any products that contain nicotine or tobacco, such as cigarettes and e-cigarettes. If you need help quitting, ask your health care provider.  Eat foods that are high in calcium and vitamin D, such as milk, cheese, and salmon. Doing this may help with any loss in bone density that is caused by the contraceptive injection. Ask your health care provider for dietary recommendations. Contact a health care provider if:  You   have nausea or vomiting.  You have abnormal vaginal discharge or bleeding.  You miss a period or you think you might be pregnant.  You experience mood changes or depression.  You feel dizzy or light-headed.  You have leg pain. Get help right away if:  You have chest pain.  You cough up blood.  You have shortness of breath.  You have a  severe headache that does not go away.  You have numbness in any part of your body.  You have slurred speech.  You have vision problems.  You have vaginal bleeding that is abnormally heavy or does not stop.  You have severe pain in your abdomen.  You have depression that does not get better with treatment. If you ever feel like you may hurt yourself or others, or have thoughts about taking your own life, get help right away. You can go to your nearest emergency department or call:  Your local emergency services (911 in the [place]  A suicide crisis helpline, such as the National Suicide Prevention Lifeline at 1-800-273-8255. This is open 24 hours a day. Summary  A contraceptive injection is a shot that prevents pregnancy. It is also called the birth control shot.  The shot is given under the skin (subcutaneous) or into a muscle (intramuscular).  After this procedure, it is common to have soreness around the injection site for a couple of days.  To prevent pregnancy, the shot must be given by a health care provider every 3 months (12 weeks).  After you have the shot, ask your health care provider whether you need to use an added method of birth control (backup contraception), such as a condom, sponge, or spermicide. This information is not intended to replace advice given to you by your health care provider. Make sure you discuss any questions you have with your health care provider. Document Released: 03/08/2017 Document Revised: 06/25/2017 Document Reviewed: 03/08/2017 Elsevier Patient Education  2020 Elsevier Inc.  

## 2019-03-01 NOTE — Progress Notes (Signed)
Post Partum Exam  Charlotte Ward is a 18 y.o. [id number] female who presents for a postpartum visit. She is five weeks postpartum following a vaginal delivery. I have fully reviewed the prenatal and intrapartum course. The delivery was at 36.6 gestational weeks due to PPROM. Anesthesia none. Postpartum course has been  uncomplicated. Baby's course has been umcomplicated. Baby is feeding by breast- no problems or concerns. Bleeding not at this time. Bowel function is normal. Bladder function is normal. Patient is not sexually active. Contraception method is nothing at this time, but would like to discuss birth control options. Postpartum depression screening:negative   The following portions of the patient's history were reviewed and updated as appropriate: allergies, current medications and problem list. Last pap smear done  Review of Systems A comprehensive review of systems was negative.    Objective:  unknown if currently breastfeeding.  General:  alert, cooperative and no distress   Breasts:  inspection negative, no nipple discharge or bleeding, no masses or nodularity palpable  Lungs: clear to auscultation bilaterally  Heart:  regular rate and rhythm  Abdomen: soft, non-tender; bowel sounds normal; no masses,  no organomegaly   Vulva:  not evaluated  Vagina: not evaluated  Cervix:  not evaluated  Corpus: not examined  Adnexa:  not evaluated  Rectal Exam: Not performed.        Assessment/Plan:  1. Postpartum care and examination - Normal postpartum exam. Pap smear not done at today's visit.  - Patient is <21yo, pap not needed until 2022  2. Encounter for prescription for depo-Provera - Educated and discussed birth control options in detail, IUD vs POPs vs Depo  - Patient decided on Depo for contraception method, initial start today - Educated on r/b of depo including side effect of weight gain and abnormal bleeding   - medroxyPROGESTERone (DEPO-PROVERA) 150 MG/ML injection; Inject 1 mL  (150 mg total) into the muscle every 3 (three) months.  Dispense: 1 mL; Refill: 3 - medroxyPROGESTERone (DEPO-PROVERA) injection 150 mg - POCT urine pregnancy   Follow up in: 3 months for depo injection or as needed.   Lajean Manes, CNM 03/01/19, 8:47 AM

## 2019-03-24 ENCOUNTER — Encounter: Payer: Self-pay | Admitting: *Deleted

## 2019-05-31 IMAGING — US US MFM OB COMP + 14 WK
1 series · 13 of 28 positions shown · non-contrast
Comparison: none

[Series 1: us mfm ob comp + 14 wk · 80 acquisitions, 13 frames shown]
[im 3/80]
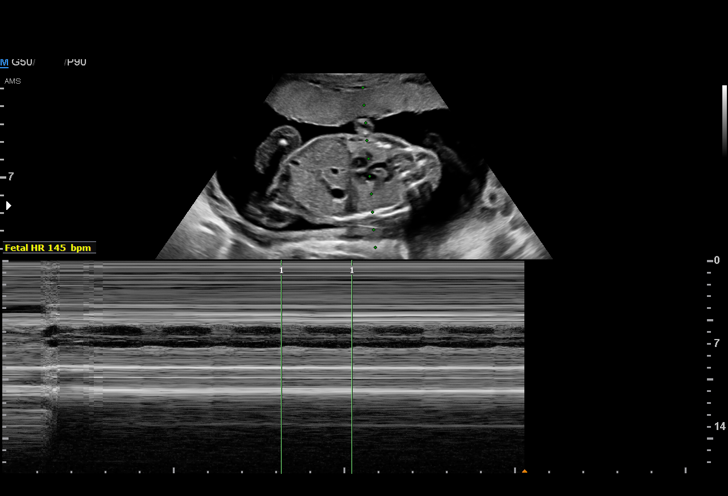
[im 9/80]
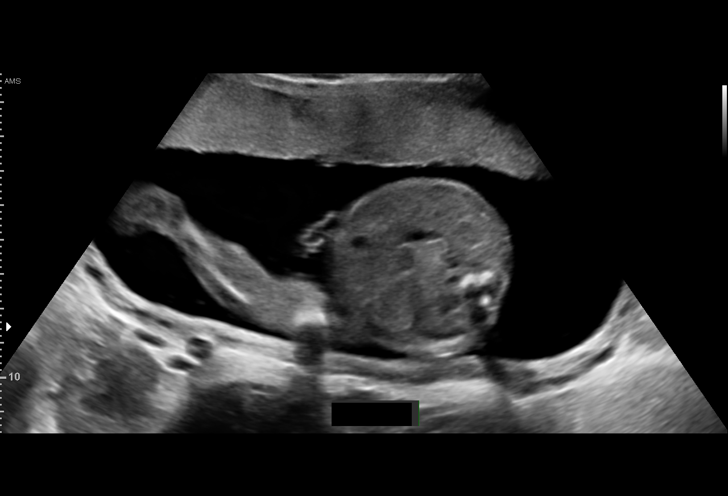
[im 15/80]
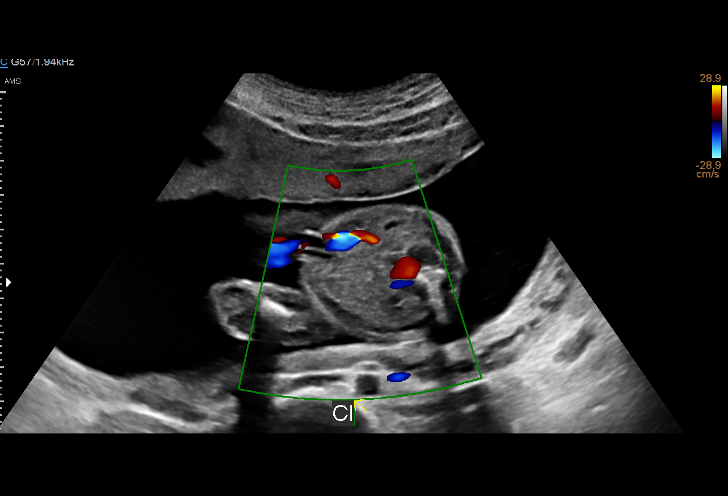
[im 21/80]
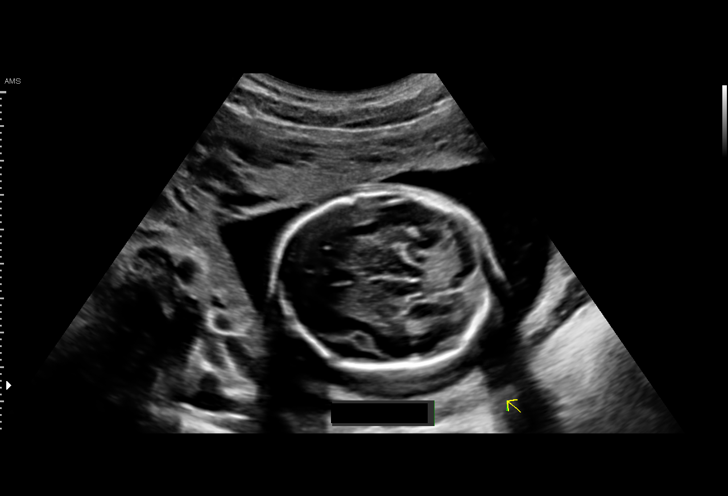
[im 27/80]
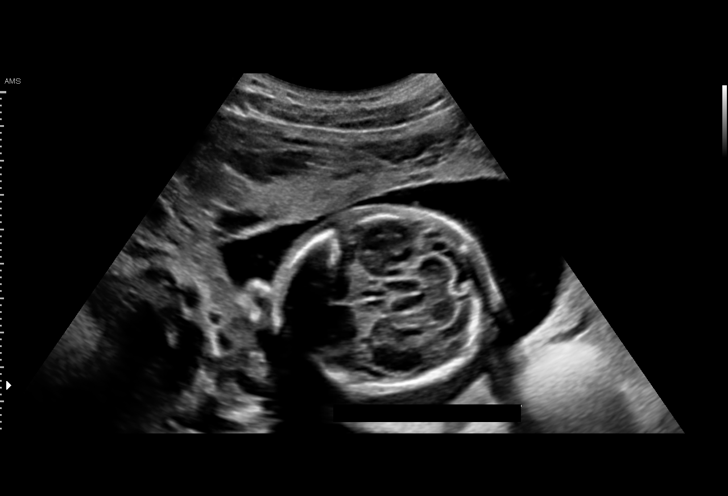
[im 33/80]
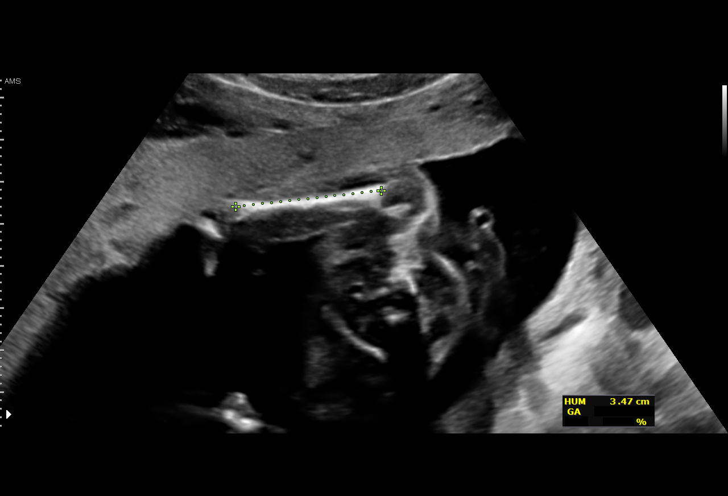
[im 41/80]
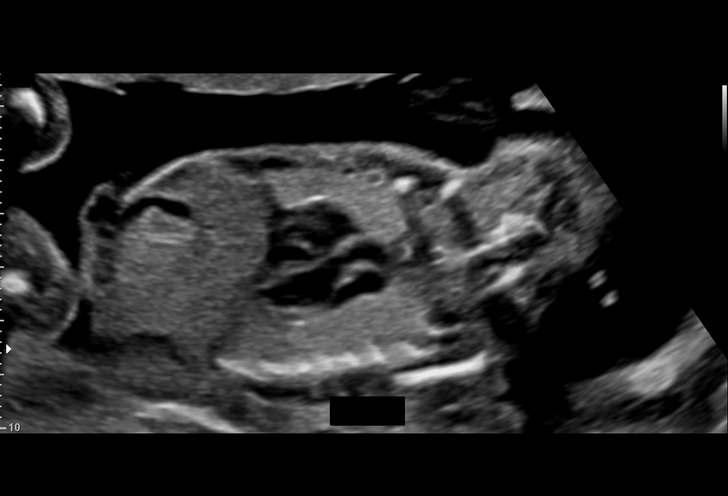
[im 47/80]
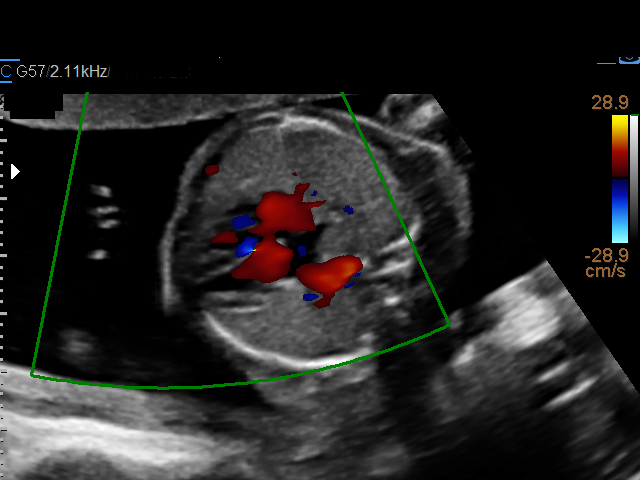
[im 53/80]
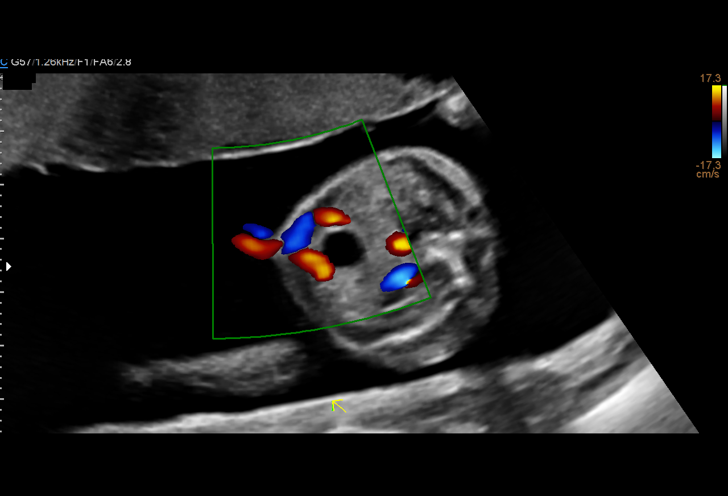
[im 59/80]
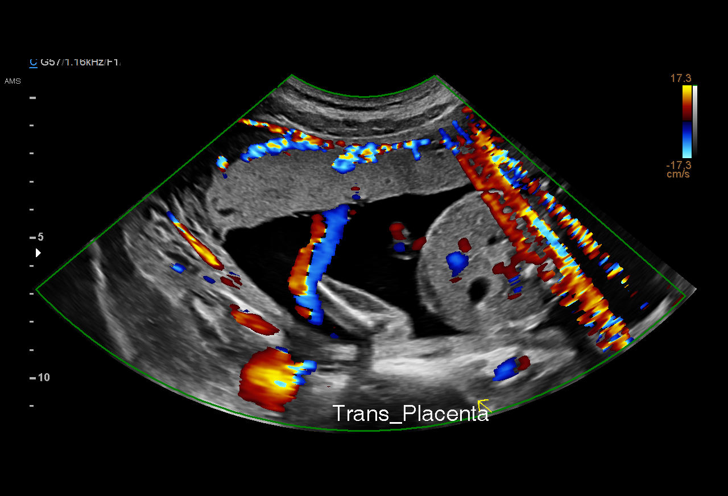
[im 65/80]
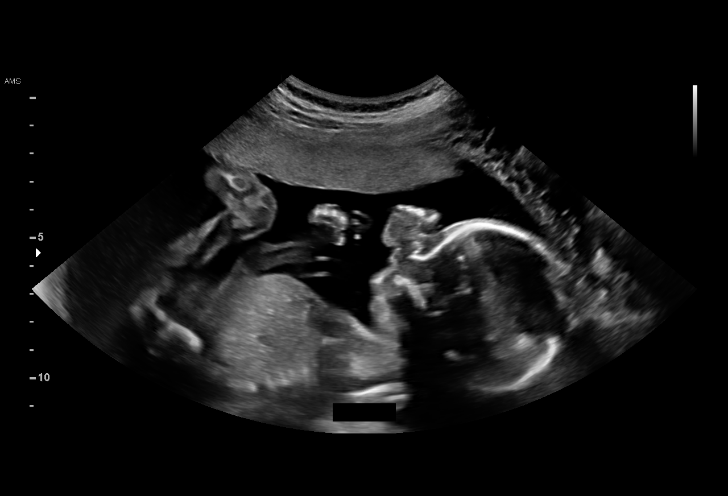
[im 71/80]
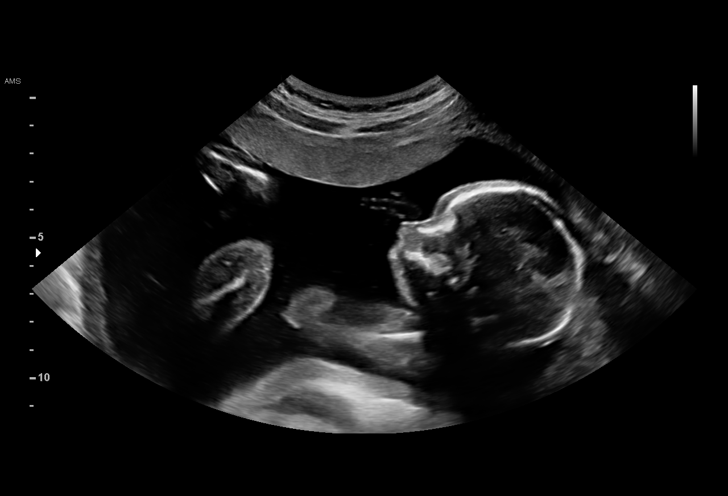
[im 77/80]
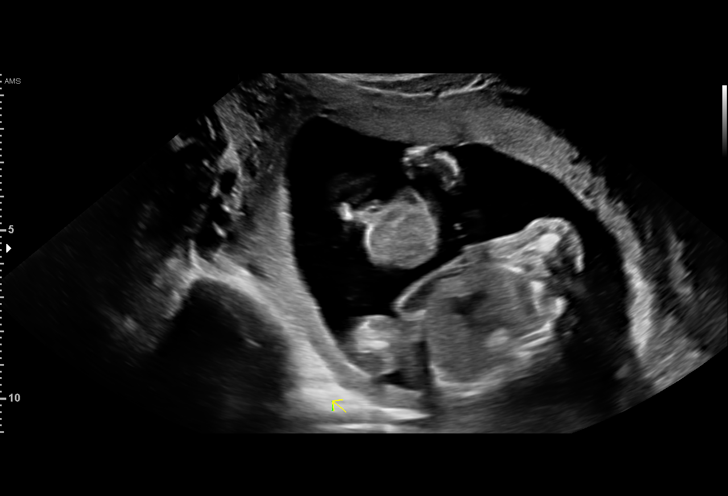

[13 of 28 positions shown; findings below may reference images not displayed]

----------------------------------------------------------------------

 ----------------------------------------------------------------------
Indications

  Encounter for antenatal screening for
  malformations
  21 weeks gestation of pregnancy
 ----------------------------------------------------------------------
Fetal Evaluation

 Num Of Fetuses:         1
 Fetal Heart Rate(bpm):  145
 Cardiac Activity:       Observed
 Presentation:           Cephalic
 Placenta:               Anterior
 P. Cord Insertion:      Visualized

 Amniotic Fluid
 AFI FV:      Within normal limits

                             Largest Pocket(cm)

Biometry

 BPD:      50.9  mm     G. Age:  21w 3d         48  %    CI:        74.55   %    70 - 86
                                                         FL/HC:      19.1   %    15.9 -
 HC:      187.1  mm     G. Age:  21w 0d         24  %    HC/AC:      1.14        1.06 -
 AC:      164.6  mm     G. Age:  21w 4d         46  %    FL/BPD:     70.1   %
 FL:       35.7  mm     G. Age:  21w 2d         36  %    FL/AC:      21.7   %    20 - 24
 HUM:      34.8  mm     G. Age:  21w 6d         61  %
 CER:      23.6  mm     G. Age:  21w 6d         61  %

 Est. FW:     419  gm    0 lb 15 oz      43  %
OB History

 Gravidity:    1
Gestational Age

 LMP:           21w 3d        Date:  05/09/18                 EDD:   02/13/19
 U/S Today:     21w 2d                                        EDD:   02/14/19
 Best:          21w 3d     Det. By:  LMP  (05/09/18)          EDD:   02/13/19
Anatomy

 Cranium:               Appears normal         Aortic Arch:            Appears normal
 Cavum:                 Appears normal         Ductal Arch:            Appears normal
 Ventricles:            Appears normal         Diaphragm:              Appears normal
 Choroid Plexus:        Appears normal         Stomach:                Appears normal, left
                                                                       sided
 Cerebellum:            Appears normal         Abdomen:                Appears normal
 Posterior Fossa:       Appears normal         Abdominal Wall:         Appears nml (cord
                                                                       insert, abd wall)
 Nuchal Fold:           Not applicable (>20    Cord Vessels:           Appears normal (3
                        wks GA)                                        vessel cord)
 Face:                  Appears normal         Kidneys:                Appear normal
                        (orbits and profile)
 Lips:                  Appears normal         Bladder:                Appears normal
 Thoracic:              Appears normal         Spine:                  Not well visualized
 Heart:                 Appears normal         Upper Extremities:      Appears normal
                        (4CH, axis, and
                        situs)
 RVOT:                  Not well visualized    Lower Extremities:      Appears normal
 LVOT:                  Not well visualized

 Other:  Nasal bone visualized. Hands and feet visualized. Female gender
         Parents do not wish to know sex of fetus.
Cervix Uterus Adnexa

 Cervix
 Not adaquately visualized

 Uterus
 No abnormality visualized.

 Left Ovary
 Not visualized.

 Right Ovary
 Not visualized.

 Cul De Sac
 No free fluid seen.

 Adnexa
 No abnormality visualized.
Impression

 We performed fetal anatomy scan. No makers of
 aneuploidies or fetal structural defects are seen. Fetal
 biometry is consistent with her previously-established dates.
 Amniotic fluid is normal and good fetal activity is seen.

 On cell-free fetal DNA screening, the risks of fetal
 aneuploidies are not increased.
Recommendations

 An appointment was made for her to return in 4 weeks for
 completion of fetal anatomy.
                 Fall, Almir

## 2019-06-01 ENCOUNTER — Ambulatory Visit: Payer: 59

## 2019-06-06 ENCOUNTER — Other Ambulatory Visit: Payer: Self-pay

## 2019-06-06 ENCOUNTER — Encounter (HOSPITAL_COMMUNITY): Payer: Self-pay

## 2019-06-06 ENCOUNTER — Ambulatory Visit (HOSPITAL_COMMUNITY)
Admission: EM | Admit: 2019-06-06 | Discharge: 2019-06-06 | Disposition: A | Discharge status: Home / Self Care | Payer: 59 | Attending: Family Medicine | Admitting: Family Medicine

## 2019-06-06 DIAGNOSIS — W260XXA Contact with knife, initial encounter: Secondary | ICD-10-CM | POA: Diagnosis not present

## 2019-06-06 DIAGNOSIS — S61214A Laceration without foreign body of right ring finger without damage to nail, initial encounter: Secondary | ICD-10-CM

## 2019-06-06 DIAGNOSIS — Y93G1 Activity, food preparation and clean up: Secondary | ICD-10-CM

## 2019-06-06 NOTE — Discharge Instructions (Signed)
WOUND CARE  . Keep area clean and dry for 24 hours. Do not remove bandage, if applied. . After 24 hours, remove bandage and wash wound gently with mild soap and warm water. Reapply a new bandage after cleaning wound, if directed. . Continue daily cleansing with soap and water until stitches/staples are removed. . Do not apply any ointments or creams to the wound while stitches/staples are in place, as this may cause delayed healing. . Notify the office if you experience any of the following signs of infection: Swelling, redness, pus drainage, streaking, fever >101.0 F . Notify the office if you experience excessive bleeding that does not stop after 15-20 minutes of constant, firm pressure.   

## 2019-06-06 NOTE — ED Triage Notes (Signed)
Patient presents to Urgent Care with complaints of laceration to right ring finger since this afternoon. Patient reports she was at work washing a knife with a brillo pad and it slipped, pt is up to date on tetanus.

## 2019-06-07 DIAGNOSIS — S61214A Laceration without foreign body of right ring finger without damage to nail, initial encounter: Secondary | ICD-10-CM | POA: Diagnosis not present

## 2019-06-07 NOTE — ED Provider Notes (Signed)
[facility]    CSN: [id number] Arrival date & time: 06/06/19  1635      History   Chief Complaint Chief Complaint  Patient presents with  . Extremity Laceration    HPI Charlotte Ward is a 18 y.o. female no significant past medical history presenting today for evaluation of laceration.  Patient sustained laceration to her right ring finger a couple of hours ago.  Patient was at work washing a knife and accidentally cut her finger.  She has been applying pressure.  Denies difficulty bending or moving her finger.  Patient states her tetanus was up-to-date.  HPI  Past Medical History:  Diagnosis Date  . Anxiety   . Supervision of normal first teen pregnancy in second trimester 09/26/2018   BabyRx 3/30--BP cuff given from office (left at desk for P/U) Nursing Staff Provider Office Location  [facility] Dating   LMP: 05/09/2018 Language   English Anatomy US  Wnl Flu Vaccine   09/26/2018 Genetic Screen  NIPS: low risk female   AFP:   TDaP vaccine   info given 11-21-18 Hgb A1C or  GTT Early  Third trimester : 2 hour wnl  Component     Latest Ref Rng & Units 11/21/2018 Glucose, Fasting     65 -    Patient Active Problem List   Diagnosis Date Noted  . Premature rupture of membranes 01/21/2019    History reviewed. No pertinent surgical history.  OB History    Gravida  1   Para  1   Term      Preterm  1   AB      Living  1     SAB      TAB      Ectopic      Multiple  0   Live Births  1            Home Medications    Prior to Admission medications   Medication Sig Start Date End Date Taking? Authorizing Provider  Ca Carbonate-Mag Hydroxide (ROLAIDS PO) Take 1 each by mouth daily as needed (For heartburn.).    [provider]  docusate sodium (COLACE) 100 MG capsule Take 1 capsule (100 mg total) by mouth 2 (two) times daily. Patient not taking: Reported on 03/01/2019 01/23/19   Judeth HornLawrence, Erin, NP  ibuprofen (ADVIL) 600 MG tablet Take 1 tablet (600 mg  total) by mouth every 6 (six) hours. 01/23/19   Judeth HornLawrence, Erin, NP  medroxyPROGESTERone (DEPO-PROVERA) 150 MG/ML injection Inject 1 mL (150 mg total) into the muscle every 3 (three) months. 03/01/19   Sharyon Cableogers, Veronica C, CNM  Prenatal Vit-Fe Fumarate-FA (PRENATAL MULTIVITAMIN) TABS tablet Take 1 tablet by mouth daily at 12 noon.    [provider]    Family History Family History  Problem Relation Age of Onset  . Asthma Mother   . Migraines Mother     Social History Social History   Tobacco Use  . Smoking status: Former Smoker    Packs/day: 0.25    Types: Cigars    Quit date: 10/08/2016    Years since quitting: 2.6  . Smokeless tobacco: Never Used  Substance Use Topics  . Alcohol use: No  . Drug use: No     Allergies   Tomato   Review of Systems Review of Systems  Constitutional: Negative for fatigue and fever.  Eyes: Negative for visual disturbance.  Respiratory: Negative for shortness of breath.   Cardiovascular: Negative for chest pain.  Gastrointestinal: Negative for abdominal pain, nausea and vomiting.  Musculoskeletal: Negative for arthralgias and joint swelling.  Skin: Positive for wound. Negative for color change and rash.  Neurological: Negative for dizziness, weakness, light-headedness and headaches.     Physical Exam Triage Vital Signs ED Triage Vitals  Enc Vitals Group     BP 06/06/19 1715 104/69     Pulse Rate 06/06/19 1715 78     Resp 06/06/19 1715 17     Temp 06/06/19 1715 98.3 F (36.8 C)     Temp Source 06/06/19 1715 Oral     SpO2 06/06/19 1715 99 %     Weight --      Height --      Head Circumference --      Peak Flow --      Pain Score 06/06/19 1714 6     Pain Loc --      Pain Edu? --      Excl. in GC? --    No data found.  Updated Vital Signs BP 104/69 (BP Location: Left Arm)   Pulse 78   Temp 98.3 F (36.8 C) (Oral)   Resp 17   SpO2 99%   Breastfeeding Yes   Visual Acuity Right Eye Distance:   Left Eye Distance:    Bilateral Distance:    Right Eye Near:   Left Eye Near:    Bilateral Near:     Physical Exam Vitals signs and nursing note reviewed.  Constitutional:      Appearance: She is well-developed.     Comments: No acute distress  HENT:     Head: Normocephalic and atraumatic.     Nose: Nose normal.  Eyes:     Conjunctiva/sclera: Conjunctivae normal.  Neck:     Musculoskeletal: Neck supple.  Cardiovascular:     Rate and Rhythm: Normal rate.  Pulmonary:     Effort: Pulmonary effort is normal. No respiratory distress.  Abdominal:     General: There is no distension.  Musculoskeletal: Normal range of motion.  Skin:    General: Skin is warm and dry.     Comments: 1 cm semicircular laceration noted to distal tip of right ring finger, edges well reapproximated without significant manipulation, depth approximately 2 to 3 mm  Neurological:     Mental Status: She is alert and oriented to person, place, and time.      UC Treatments / Results  Labs (all labs ordered are listed, but only abnormal results are displayed) Labs Reviewed - No data to display  EKG   Radiology No results found.  Procedures Laceration Repair  Date/Time: 06/07/2019 11:29 AM Performed by: , Junius Creamer, PA-C Authorized by: Eustace Moore, MD   Consent:    Consent obtained:  Verbal   Consent given by:  Patient   Risks discussed:  Infection, poor cosmetic result and pain   Alternatives discussed:  No treatment Anesthesia (see MAR for exact dosages):    Anesthesia method:  None Laceration details:    Location:  Finger   Finger location:  R ring finger   Length (cm):  1   Depth (mm):  2 Repair type:    Repair type:  Simple Pre-procedure details:    Preparation:  Patient was prepped and draped in usual sterile fashion Exploration:    Hemostasis achieved with:  Direct pressure and tourniquet   Wound extent: no foreign bodies/material noted, no muscle damage noted, no tendon damage noted and  no underlying fracture  noted     Contaminated: no   Treatment:    Area cleansed with:  Shur-Clens   Amount of cleaning:  Standard   Irrigation solution:  Sterile water   Irrigation volume:  200   Irrigation method:  Syringe   Visualized foreign bodies/material removed: no   Skin repair:    Repair method:  Tissue adhesive Approximation:    Approximation:  Close Post-procedure details:    Dressing:  Non-adherent dressing   Patient tolerance of procedure:  Tolerated well, no immediate complications   (including critical care time)  Medications Ordered in UC Medications - No data to display  Initial Impression / Assessment and Plan / UC Course  I have reviewed the triage vital signs and the nursing notes.  Pertinent labs & imaging results that were available during my care of the patient were reviewed by me and considered in my medical decision making (see chart for details).    Laceration repaired with Dermabond, nonadherent dressing applied.  No immediate complications, discussed wound care and monitoring for signs of infection.  Tetanus up-to-date.  Monitor for gradual healing.Discussed strict return precautions. Patient verbalized understanding and is agreeable with plan.  Final Clinical Impressions(s) / UC Diagnoses   Final diagnoses:  Laceration of right ring finger without foreign body without damage to nail, initial encounter     Discharge Instructions     WOUND CARE . Keep area clean and dry for 24 hours. Do not remove bandage, if applied. . After 24 hours, remove bandage and wash wound gently with mild soap and warm water. Reapply a new bandage after cleaning wound, if directed. . Continue daily cleansing with soap and water until stitches/staples are removed. . Do not apply any ointments or creams to the wound while stitches/staples are in place, as this may cause delayed healing. . Notify the office if you experience any of the following signs of infection:  Swelling, redness, pus drainage, streaking, fever >101.0 F . Notify the office if you experience excessive bleeding that does not stop after 15-20 minutes of constant, firm pressure.   ED Prescriptions    None     PDMP not reviewed this encounter.   Janith Lima, PA-C 06/07/19 1131

## 2019-06-08 ENCOUNTER — Telehealth: Payer: Self-pay | Admitting: Obstetrics

## 2020-01-22 ENCOUNTER — Other Ambulatory Visit (HOSPITAL_COMMUNITY)
Admission: RE | Admit: 2020-01-22 | Discharge: 2020-01-22 | Disposition: A | Discharge status: Home / Self Care | Payer: Medicaid Other | Source: Ambulatory Visit | Attending: Obstetrics and Gynecology | Admitting: Obstetrics and Gynecology

## 2020-01-22 ENCOUNTER — Ambulatory Visit (INDEPENDENT_AMBULATORY_CARE_PROVIDER_SITE_OTHER): Payer: Medicaid Other

## 2020-01-22 ENCOUNTER — Other Ambulatory Visit: Payer: Self-pay

## 2020-01-22 DIAGNOSIS — Z113 Encounter for screening for infections with a predominantly sexual mode of transmission: Secondary | ICD-10-CM | POA: Insufficient documentation

## 2020-01-22 DIAGNOSIS — N898 Other specified noninflammatory disorders of vagina: Secondary | ICD-10-CM

## 2020-01-22 DIAGNOSIS — Z30013 Encounter for initial prescription of injectable contraceptive: Secondary | ICD-10-CM

## 2020-01-22 DIAGNOSIS — Z3202 Encounter for pregnancy test, result negative: Secondary | ICD-10-CM

## 2020-01-22 LAB — POCT URINE PREGNANCY: Preg Test, Ur: NEGATIVE

## 2020-01-22 MED ORDER — MEDROXYPROGESTERONE ACETATE 150 MG/ML IM SUSP
150.0000 mg | INTRAMUSCULAR | 0 refills | Status: DC
Start: 2020-01-22 — End: 2020-10-10

## 2020-01-22 NOTE — Progress Notes (Signed)
Patient was assessed and managed by nursing staff during this encounter. I have reviewed the chart and agree with the documentation and plan. I have also made any necessary editorial changes.  Catalina Antigua, MD 01/22/2020 12:56 PM

## 2020-01-22 NOTE — Progress Notes (Signed)
Pt presents to restart Depo 1st UPT - neg Pt aware no IC x 2 wks, return to office with Depo rx, and if 2nd UPT neg, we will administer the shot.  During visit pt c/o vaginal discharge and requests all STD testing.    SUBJECTIVE:  19 y.o. female complains of white vaginal discharge. Denies abnormal vaginal bleeding or significant pelvic pain or fever. No UTI symptoms. Denies history of known exposure to STD.  No LMP recorded. Patient has had an injection.  OBJECTIVE:  She appears well, afebrile. Urine dipstick: not done.  ASSESSMENT:  Vaginal Discharge    PLAN:  GC, chlamydia, trichomonas, BVAG, CVAG probe sent to lab. HIV, HEP B, HEP C, RPR drawn today  Treatment: To be determined once lab results are received ROV prn if symptoms persist or worsen.

## 2020-01-23 LAB — HIV ANTIBODY (ROUTINE TESTING W REFLEX): HIV Screen 4th Generation wRfx: NONREACTIVE

## 2020-01-23 LAB — RPR: RPR Ser Ql: NONREACTIVE

## 2020-01-23 LAB — HEPATITIS C ANTIBODY: Hep C Virus Ab: <0.1 s/co ratio (ref 0.0–0.9)

## 2020-01-23 LAB — HEPATITIS B SURFACE ANTIGEN: Hepatitis B Surface Ag: NEGATIVE

## 2020-01-24 LAB — CERVICOVAGINAL ANCILLARY ONLY
Bacterial Vaginitis (gardnerella): POSITIVE — AB
Candida Glabrata: NEGATIVE
Candida Vaginitis: NEGATIVE
Chlamydia: POSITIVE — AB
Comment: NEGATIVE
Comment: NEGATIVE
Comment: NEGATIVE
Comment: NEGATIVE
Comment: NEGATIVE
Comment: NORMAL
Neisseria Gonorrhea: NEGATIVE
Trichomonas: NEGATIVE

## 2020-01-25 DIAGNOSIS — Z419 Encounter for procedure for purposes other than remedying health state, unspecified: Secondary | ICD-10-CM | POA: Diagnosis not present

## 2020-01-25 MED ORDER — METRONIDAZOLE 500 MG PO TABS: 500.0000 mg | ORAL_TABLET | Freq: Two times a day (BID) | ORAL | 0 refills | Status: DC

## 2020-01-25 MED ORDER — AZITHROMYCIN 500 MG PO TABS
1000.0000 mg | ORAL_TABLET | Freq: Once | ORAL | 1 refills | Status: DC
Start: 2020-01-25 — End: 2020-02-02

## 2020-01-25 NOTE — Addendum Note (Signed)
Addended by: Catalina Antigua on: 01/25/2020 10:14 AM   Modules accepted: Orders

## 2020-02-02 ENCOUNTER — Telehealth: Payer: Self-pay

## 2020-02-02 MED ORDER — AZITHROMYCIN 500 MG PO TABS: 1000.0000 mg | ORAL_TABLET | Freq: Once | ORAL | 1 refills | Status: AC

## 2020-02-02 NOTE — Telephone Encounter (Signed)
Returned call, no answer, left vm 

## 2020-02-05 ENCOUNTER — Ambulatory Visit: Payer: Medicaid Other

## 2020-02-25 DIAGNOSIS — Z419 Encounter for procedure for purposes other than remedying health state, unspecified: Secondary | ICD-10-CM | POA: Diagnosis not present

## 2020-03-27 DIAGNOSIS — Z419 Encounter for procedure for purposes other than remedying health state, unspecified: Secondary | ICD-10-CM | POA: Diagnosis not present

## 2020-04-10 ENCOUNTER — Other Ambulatory Visit: Payer: Self-pay

## 2020-04-10 ENCOUNTER — Ambulatory Visit
Admission: EM | Admit: 2020-04-10 | Discharge: 2020-04-10 | Disposition: A | Discharge status: Home / Self Care | Payer: Medicaid Other | Attending: Emergency Medicine | Admitting: Emergency Medicine

## 2020-04-10 DIAGNOSIS — J069 Acute upper respiratory infection, unspecified: Secondary | ICD-10-CM | POA: Diagnosis not present

## 2020-04-10 NOTE — ED Provider Notes (Signed)
[facility]    CSN: [id number] Arrival date & time: 04/10/20  1727      History   Chief Complaint Chief Complaint  Patient presents with   Chills   Nasal Congestion   Generalized Body Aches    HPI Charlotte Ward is a 19 y.o. female.   Patient presents with body aches, chills, nasal congestion x1 day.  She denies fever, rash, sore throat, cough, shortness of breath, vomiting, diarrhea, or other symptoms.  Treatment attempted at home with allergy medication and Alka-Seltzer.  The history is provided by the patient.    Past Medical History:  Diagnosis Date   Anxiety    Supervision of normal first teen pregnancy in second trimester 09/26/2018   BabyRx 3/30--BP cuff given from office (left at desk for P/U) Nursing Staff Provider Office Location  [facility] Dating   LMP: 05/09/2018 Language   English Anatomy US  Wnl Flu Vaccine   09/26/2018 Genetic Screen  NIPS: low risk female   AFP:   TDaP vaccine   info given 11-21-18 Hgb A1C or  GTT Early  Third trimester : 2 hour wnl  Component     Latest Ref Rng & Units 11/21/2018 Glucose, Fasting     65 -    Patient Active Problem List   Diagnosis Date Noted   Premature rupture of membranes 01/21/2019    History reviewed. No pertinent surgical history.  OB History    Gravida  1   Para  1   Term      Preterm  1   AB      Living  1     SAB      TAB      Ectopic      Multiple  0   Live Births  1            Home Medications    Prior to Admission medications   Medication Sig Start Date End Date Taking? Authorizing Provider  Ca Carbonate-Mag Hydroxide (ROLAIDS PO) Take 1 each by mouth daily as needed (For heartburn.).    [provider]  docusate sodium (COLACE) 100 MG capsule Take 1 capsule (100 mg total) by mouth 2 (two) times daily. Patient not taking: Reported on 03/01/2019 01/23/19   Judeth Horn, NP  ibuprofen (ADVIL) 600 MG tablet Take 1 tablet (600 mg total) by mouth every 6 (six) hours.  01/23/19   Judeth Horn, NP  medroxyPROGESTERone (DEPO-PROVERA) 150 MG/ML injection Inject 1 mL (150 mg total) into the muscle every 3 (three) months. 03/01/19   Sharyon Cable, CNM  medroxyPROGESTERone (DEPO-PROVERA) 150 MG/ML injection Inject 1 mL (150 mg total) into the muscle every 3 (three) months. 01/22/20   Constant, Gigi Gin, MD  metroNIDAZOLE (FLAGYL) 500 MG tablet Take 1 tablet (500 mg total) by mouth 2 (two) times daily. 01/25/20   Constant, Peggy, MD  Prenatal Vit-Fe Fumarate-FA (PRENATAL MULTIVITAMIN) TABS tablet Take 1 tablet by mouth daily at 12 noon.    [provider]    Family History Family History  Problem Relation Age of Onset   Asthma Mother    Migraines Mother     Social History Social History   Tobacco Use   Smoking status: Former Smoker    Packs/day: 0.25    Types: Cigars    Quit date: 10/08/2016    Years since quitting: 3.5   Smokeless tobacco: Never Used  Vaping Use   Vaping Use: Never assessed  Substance Use Topics  Alcohol use: No   Drug use: No     Allergies   Tomato   Review of Systems Review of Systems  Constitutional: Positive for chills. Negative for fever.  HENT: Positive for congestion. Negative for ear pain and sore throat.   Eyes: Negative for pain and visual disturbance.  Respiratory: Negative for cough and shortness of breath.   Cardiovascular: Negative for chest pain and palpitations.  Gastrointestinal: Negative for abdominal pain, diarrhea and vomiting.  Genitourinary: Negative for dysuria and hematuria.  Musculoskeletal: Negative for arthralgias and back pain.  Skin: Negative for color change and rash.  Neurological: Negative for seizures and syncope.  All other systems reviewed and are negative.    Physical Exam Triage Vital Signs ED Triage Vitals  Enc Vitals Group     BP      Pulse      Resp      Temp      Temp src      SpO2      Weight      Height      Head Circumference      Peak Flow       Pain Score      Pain Loc      Pain Edu?      Excl. in GC?    No data found.  Updated Vital Signs BP 102/66    Pulse 92    Temp 99.5 F (37.5 C)    Resp 16    LMP 04/03/2020 (Approximate)    SpO2 97%   Visual Acuity Right Eye Distance:   Left Eye Distance:   Bilateral Distance:    Right Eye Near:   Left Eye Near:    Bilateral Near:     Physical Exam Vitals and nursing note reviewed.  Constitutional:      General: She is not in acute distress.    Appearance: She is well-developed. She is not ill-appearing.  HENT:     Head: Normocephalic and atraumatic.     Right Ear: Tympanic membrane normal.     Left Ear: Tympanic membrane normal.     Nose: Nose normal.     Mouth/Throat:     Mouth: Mucous membranes are moist.     Pharynx: Oropharynx is clear.  Eyes:     Conjunctiva/sclera: Conjunctivae normal.  Cardiovascular:     Rate and Rhythm: Normal rate and regular rhythm.     Heart sounds: No murmur heard.   Pulmonary:     Effort: Pulmonary effort is normal. No respiratory distress.     Breath sounds: Normal breath sounds.  Abdominal:     Palpations: Abdomen is soft.     Tenderness: There is no abdominal tenderness. There is no guarding or rebound.  Musculoskeletal:     Cervical back: Neck supple.  Skin:    General: Skin is warm and dry.     Findings: No rash.  Neurological:     General: No focal deficit present.     Mental Status: She is alert and oriented to person, place, and time.     Gait: Gait normal.  Psychiatric:        Mood and Affect: Mood normal.        Behavior: Behavior normal.      UC Treatments / Results  Labs (all labs ordered are listed, but only abnormal results are displayed) Labs Reviewed  NOVEL CORONAVIRUS, NAA    EKG   Radiology No results found.  Procedures Procedures (  including critical care time)  Medications Ordered in UC Medications - No data to display  Initial Impression / Assessment and Plan / UC Course  I have  reviewed the triage vital signs and the nursing notes.  Pertinent labs & imaging results that were available during my care of the patient were reviewed by me and considered in my medical decision making (see chart for details).   Viral URI.  PCR COVID pending.  Instructed patient to self quarantine until the test result is back.  Discussed symptomatic treatment including Tylenol, Mucinex, rest, hydration.  Instructed patient to go to the ED if she has acute worsening symptoms.  Patient agrees to plan of care.    Final Clinical Impressions(s) / UC Diagnoses   Final diagnoses:  Viral URI     Discharge Instructions     Your COVID test is pending.  You should self quarantine until the test result is back.    Take Tylenol as needed for fever or discomfort.  Rest and keep yourself hydrated.    Go to the emergency department if you develop acute worsening symptoms.        ED Prescriptions    None     PDMP not reviewed this encounter.   Mickie Bail, NP 04/10/20 1757

## 2020-04-10 NOTE — ED Triage Notes (Signed)
Patient complains of chills, congestion, and body aches onset yesterday. Patient is not vaccinated, but reports she wears a mask. Agreeable to COVID testing.

## 2020-04-10 NOTE — Discharge Instructions (Signed)
Your COVID test is pending.  You should self quarantine until the test result is back.    Take Tylenol as needed for fever or discomfort.  Rest and keep yourself hydrated.    Go to the emergency department if you develop acute worsening symptoms.     

## 2020-04-13 LAB — NOVEL CORONAVIRUS, NAA: SARS-CoV-2, NAA: NOT DETECTED

## 2020-04-13 LAB — SARS-COV-2, NAA 2 DAY TAT

## 2020-04-26 DIAGNOSIS — Z419 Encounter for procedure for purposes other than remedying health state, unspecified: Secondary | ICD-10-CM | POA: Diagnosis not present

## 2020-05-27 DIAGNOSIS — Z419 Encounter for procedure for purposes other than remedying health state, unspecified: Secondary | ICD-10-CM | POA: Diagnosis not present

## 2020-06-18 ENCOUNTER — Telehealth: Payer: Self-pay

## 2020-06-18 NOTE — Telephone Encounter (Signed)
VM left at end of the day yesterday requesting refill on eczema cream. Pt states she has no relief w/hydrocortisone cream. Message sent to [facility]  to advise since this request is not on triage/nurse protocol.

## 2020-06-24 ENCOUNTER — Telehealth: Payer: Self-pay

## 2020-06-24 NOTE — Telephone Encounter (Signed)
LMTCB concerning a  Cream for her eczema. This is not on her med list.

## 2020-06-26 ENCOUNTER — Other Ambulatory Visit: Payer: Self-pay

## 2020-06-26 ENCOUNTER — Encounter (HOSPITAL_COMMUNITY): Payer: Self-pay

## 2020-06-26 ENCOUNTER — Ambulatory Visit (HOSPITAL_COMMUNITY)
Admission: EM | Admit: 2020-06-26 | Discharge: 2020-06-26 | Disposition: A | Discharge status: Home / Self Care | Payer: Medicaid Other | Attending: Family Medicine | Admitting: Family Medicine

## 2020-06-26 DIAGNOSIS — L309 Dermatitis, unspecified: Secondary | ICD-10-CM | POA: Diagnosis not present

## 2020-06-26 DIAGNOSIS — Z419 Encounter for procedure for purposes other than remedying health state, unspecified: Secondary | ICD-10-CM | POA: Diagnosis not present

## 2020-06-26 MED ORDER — TRIAMCINOLONE ACETONIDE 0.1 % EX CREA: 1.0000 "application " | TOPICAL_CREAM | Freq: Two times a day (BID) | CUTANEOUS | 2 refills | Status: DC

## 2020-06-26 NOTE — ED Triage Notes (Signed)
Pt presents for refill on eczema cream.

## 2020-07-01 NOTE — ED Provider Notes (Signed)
[facility]   [id number] 06/26/20 Arrival Time: 1900  ASSESSMENT & PLAN:  1. Eczema, unspecified type     Refilled: Meds ordered this encounter  Medications  . triamcinolone (KENALOG) 0.1 %    Sig: Apply 1 application topically 2 (two) times daily.    Dispense:  45 g    Refill:  2    Skin moisturizers; importance of.  Will follow up with PCP or here if worsening or failing to improve as anticipated. Reviewed expectations re: course of current medical issues. Questions answered. Outlined signs and symptoms indicating need for more acute intervention. Patient verbalized understanding. After Visit Summary given.   SUBJECTIVE:  Charlotte Ward is a 19 y.o. female who presents with a skin complaint. Eczema flare with recent cold and dry weather. Out of triamcinolone. Needs refill. Otherwise well. Arms mainly involved.  OBJECTIVE: Vitals:   06/26/20 1953  BP: 126/60  Pulse: 65  Resp: 18  Temp: 99 F (37.2 C)  TempSrc: Oral  SpO2: 100%    General appearance: alert; no distress HEENT: [place]; AT Neck: supple with FROM Lungs: clear to auscultation bilaterally Heart: regular rate and rhythm Extremities: no edema; moves all extremities normally Skin: warm and dry; eczema of bilateral forearms Psychological: alert and cooperative; normal mood and affect  Allergies  Allergen Reactions  . Tomato Swelling    Patient reports only allergic to RAW tomatoes.    Past Medical History:  Diagnosis Date  . Anxiety   . Supervision of normal first teen pregnancy in second trimester 09/26/2018   BabyRx 3/30--BP cuff given from office (left at desk for P/U) Nursing Staff Provider Office Location  [facility] Dating   LMP: 05/09/2018 Language   English Anatomy US  Wnl Flu Vaccine   09/26/2018 Genetic Screen  NIPS: low risk female   AFP:   TDaP vaccine   info given 11-21-18 Hgb A1C or  GTT Early  Third trimester : 2 hour wnl  Component     Latest Ref Rng & Units 11/21/2018 Glucose, Fasting      65 -   Social History   Socioeconomic History  . Marital status: Single    Spouse name: Not on file  . Number of children: Not on file  . Years of education: Not on file  . Highest education level: High school graduate  Occupational History  . Not on file  Tobacco Use  . Smoking status: Former Smoker    Packs/day: 0.25    Types: Cigars    Quit date: 10/08/2016    Years since quitting: 3.7  . Smokeless tobacco: Never Used  Vaping Use  . Vaping Use: Never assessed  Substance and Sexual Activity  . Alcohol use: No  . Drug use: No  . Sexual activity: Yes    Birth control/protection: None  Other Topics Concern  . Not on file  Social History Narrative  . Not on file   Social Determinants of Health   Financial Resource Strain:   . Difficulty of Paying Living Expenses: Not on file  Food Insecurity:   . Worried About Programme researcher, broadcasting/film/video in the Last Year: Not on file  . Ran Out of Food in the Last Year: Not on file  Transportation Needs:   . Lack of Transportation (Medical): Not on file  . Lack of Transportation (Non-Medical): Not on file  Physical Activity:   . Days of Exercise per Week: Not on file  . Minutes of Exercise per Session: Not on  file  Stress:   . Feeling of Stress : Not on file  Social Connections:   . Frequency of Communication with Friends and Family: Not on file  . Frequency of Social Gatherings with Friends and Family: Not on file  . Attends Religious Services: Not on file  . Active Member of Clubs or Organizations: Not on file  . Attends Banker Meetings: Not on file  . Marital Status: Not on file  Intimate Partner Violence:   . Fear of Current or Ex-Partner: Not on file  . Emotionally Abused: Not on file  . Physically Abused: Not on file  . Sexually Abused: Not on file   Family History  Problem Relation Age of Onset  . Asthma Mother   . Migraines Mother    History reviewed. No pertinent surgical history.   Mardella Layman,  MD 07/01/20 [phone]

## 2020-07-27 DIAGNOSIS — Z419 Encounter for procedure for purposes other than remedying health state, unspecified: Secondary | ICD-10-CM | POA: Diagnosis not present

## 2020-07-27 NOTE — L&D Delivery Note (Signed)
OB/GYN Faculty Practice Delivery Note  Charlotte Ward is a 19 y.o. G2P0101 s/p SVD at [redacted]w[redacted]d. She was admitted for IOL for LGA.   ROM: 5h 31m with clear fluid GBS Status: Negative Maximum Maternal Temperature: 99  Labor Progress: Patient presented for IOL, had a foley balloon placed which came out, then she was started on pitocin and her membranes were ruptured and she progressed to fully dilated  Delivery Date/Time: 0443 on 04/28/2021 Delivery: Called to room and patient was complete and pushing. Patient was pushing un-medicated, while crowning due to tight perineal band and fetal heart tone intermittently in 60s, patient was given local anesthesia and an episiotomy was cut by Dr. Donavan Foil. Head delivered LOA. Double nuchal cord present and reduced. Shoulder and body delivered in usual fashion. Infant did not cry immediately and was briefly placed on mother's abdomen, dried and stimulated and cord clamped x 2 immediately, and cut by Dr. Ephriam Jenkins. Cord gas and Cord blood drawn. Placenta delivered spontaneously with gentle cord traction. Fundus firm with massage and Pitocin. Labia, perineum, vagina, and cervix inspected and the second degree laceration at the site of the episiotomy was repaired with 3-0 Vicryl  Placenta: Intact, 3V cord, L&D Complications: None Lacerations: Midline episiotomy EBL: 192cc Analgesia: Local anesthesia with lidocaine for episiotomy and repair   Infant: female  APGARs 6,9  4210g  Warner Mccreedy, MD, MPH OB Fellow, Faculty Practice Center for [facility], [facility] 04/28/2021, 5:25 AM

## 2020-08-27 DIAGNOSIS — Z419 Encounter for procedure for purposes other than remedying health state, unspecified: Secondary | ICD-10-CM | POA: Diagnosis not present

## 2020-09-18 ENCOUNTER — Other Ambulatory Visit: Payer: Self-pay

## 2020-09-18 ENCOUNTER — Ambulatory Visit (INDEPENDENT_AMBULATORY_CARE_PROVIDER_SITE_OTHER): Payer: Medicaid Other

## 2020-09-18 DIAGNOSIS — Z3200 Encounter for pregnancy test, result unknown: Secondary | ICD-10-CM

## 2020-09-18 LAB — POCT URINE PREGNANCY: Preg Test, Ur: POSITIVE — AB

## 2020-09-18 NOTE — Progress Notes (Signed)
..   Charlotte Ward presents today for UPT. She has no unusual complaints. LMP:07-27-20    OBJECTIVE: Appears well, in no apparent distress.  OB History    Gravida  2   Para  1   Term      Preterm  1   AB      Living  1     SAB      IAB      Ectopic      Multiple  0   Live Births  1          Home UPT Result:Positive In-Office UPT result:Positive I have reviewed the patient's medical, obstetrical, social, and family histories, and medications.   ASSESSMENT: Positive pregnancy test  PLAN Prenatal care to be completed at: [facility]

## 2020-09-24 DIAGNOSIS — Z419 Encounter for procedure for purposes other than remedying health state, unspecified: Secondary | ICD-10-CM | POA: Diagnosis not present

## 2020-10-10 ENCOUNTER — Ambulatory Visit (INDEPENDENT_AMBULATORY_CARE_PROVIDER_SITE_OTHER): Payer: Medicaid Other

## 2020-10-10 ENCOUNTER — Other Ambulatory Visit: Payer: Self-pay

## 2020-10-10 DIAGNOSIS — Z789 Other specified health status: Secondary | ICD-10-CM | POA: Diagnosis not present

## 2020-10-10 DIAGNOSIS — Z3401 Encounter for supervision of normal first pregnancy, first trimester: Secondary | ICD-10-CM

## 2020-10-10 DIAGNOSIS — O3680X0 Pregnancy with inconclusive fetal viability, not applicable or unspecified: Secondary | ICD-10-CM

## 2020-10-10 DIAGNOSIS — Z3481 Encounter for supervision of other normal pregnancy, first trimester: Secondary | ICD-10-CM

## 2020-10-10 DIAGNOSIS — Z3480 Encounter for supervision of other normal pregnancy, unspecified trimester: Secondary | ICD-10-CM | POA: Insufficient documentation

## 2020-10-10 DIAGNOSIS — O219 Vomiting of pregnancy, unspecified: Secondary | ICD-10-CM

## 2020-10-10 DIAGNOSIS — Z3491 Encounter for supervision of normal pregnancy, unspecified, first trimester: Secondary | ICD-10-CM | POA: Insufficient documentation

## 2020-10-10 MED ORDER — PROMETHAZINE HCL 25 MG PO TABS: 25.0000 mg | ORAL_TABLET | Freq: Four times a day (QID) | ORAL | 2 refills | Status: DC | PRN

## 2020-10-10 NOTE — Progress Notes (Signed)
PRENATAL INTAKE SUMMARY  [name] presents today New OB Nurse Interview.  OB History    Gravida  2   Para  1   Term      Preterm  1   AB      Living  1     SAB      IAB      Ectopic      Multiple  0   Live Births  1          I have reviewed the patient's medical, obstetrical, social, and family histories, medications, and available lab results.  SUBJECTIVE She has no unusual complaints  OBJECTIVE Initial Physical Exam (New OB)  GENERAL APPEARANCE: alert, well appearing   ASSESSMENT Normal pregnancy  PLAN Prenatal care to be completed at [place] to be completed at [place] ordered Blood pressure kit given U/S performed today reveals single live IUP at [redacted]w[redacted]d by [place]. FHR 167. PHQ 2 score: 0 GAD 7 score: 1

## 2020-10-14 NOTE — Progress Notes (Signed)
Patient was assessed and managed by nursing staff during this encounter. I have reviewed the chart and agree with the documentation and plan. I have also made any necessary editorial changes.  Catalina Antigua, MD 10/14/2020 11:00 AM

## 2020-10-16 ENCOUNTER — Other Ambulatory Visit: Payer: Self-pay

## 2020-10-16 ENCOUNTER — Other Ambulatory Visit (HOSPITAL_COMMUNITY)
Admission: RE | Admit: 2020-10-16 | Discharge: 2020-10-16 | Disposition: A | Discharge status: Home / Self Care | Payer: Medicaid Other | Source: Ambulatory Visit | Attending: Obstetrics and Gynecology | Admitting: Obstetrics and Gynecology

## 2020-10-16 ENCOUNTER — Ambulatory Visit (INDEPENDENT_AMBULATORY_CARE_PROVIDER_SITE_OTHER): Payer: Medicaid Other | Admitting: Obstetrics and Gynecology

## 2020-10-16 VITALS — BP 113/61 | HR 90 | Wt 123.0 lb

## 2020-10-16 DIAGNOSIS — Z3A11 11 weeks gestation of pregnancy: Secondary | ICD-10-CM | POA: Diagnosis not present

## 2020-10-16 DIAGNOSIS — O42919 Preterm premature rupture of membranes, unspecified as to length of time between rupture and onset of labor, unspecified trimester: Secondary | ICD-10-CM

## 2020-10-16 DIAGNOSIS — Z3481 Encounter for supervision of other normal pregnancy, first trimester: Secondary | ICD-10-CM

## 2020-10-16 DIAGNOSIS — B3731 Acute candidiasis of vulva and vagina: Secondary | ICD-10-CM | POA: Insufficient documentation

## 2020-10-16 DIAGNOSIS — B373 Candidiasis of vulva and vagina: Secondary | ICD-10-CM | POA: Insufficient documentation

## 2020-10-16 MED ORDER — TERCONAZOLE 0.4 % VA CREA: 1.0000 | TOPICAL_CREAM | Freq: Every day | VAGINAL | 0 refills | Status: DC

## 2020-10-16 NOTE — Progress Notes (Signed)
History:   Charlotte Ward is a 20 y.o. G2P0101 at [redacted]w[redacted]d by LMP being seen today for her first obstetrical visit.  Her obstetrical history is significant for PPROM @ 36w.5d. Patient does intend to breast feed. Pregnancy history fully reviewed.  34 year old daughter at home.  FOB involved, unplanned pregnancy but happy.   Patient reports no complaints.      HISTORY: OB History  Gravida Para Term Preterm AB Living  2 1 0 1 0 1  SAB IAB Ectopic Multiple Live Births  0 0 0 0 1    # Outcome Date GA Lbr Len/2nd Weight Sex Delivery Anes PTL Lv  2 Current           1 Preterm 01/22/19 [redacted]w[redacted]d 02:02 / 00:27 6 lb 9.8 oz (3 kg) F Vag-Spont Local  LIV     Name: Charlotte Ward     Apgar1: 8  Apgar5: 9    Last pap smear was done today 10/16/20  Past Medical History:  Diagnosis Date  . Anxiety   . Supervision of normal first teen pregnancy in second trimester 09/26/2018   BabyRx 3/30--BP cuff given from office (left at desk for P/U) Nursing Staff Provider Office Location  [facility] Dating   LMP: 05/09/2018 Language   English Anatomy US  Wnl Flu Vaccine   09/26/2018 Genetic Screen  NIPS: low risk female   AFP:   TDaP vaccine   info given 11-21-18 Hgb A1C or  GTT Early  Third trimester : 2 hour wnl  Component     Latest Ref Rng & Units 11/21/2018 Glucose, Fasting     65 -   No past surgical history on file. Family History  Problem Relation Age of Onset  . Asthma Mother   . Migraines Mother    Social History   Tobacco Use  . Smoking status: Former Smoker    Packs/day: 0.25    Types: Cigars    Quit date: 10/08/2016    Years since quitting: 4.0  . Smokeless tobacco: Never Used  Vaping Use  . Vaping Use: Former  . Devices: Not since confirmed pregnancy  Substance Use Topics  . Alcohol use: No  . Drug use: No   Allergies  Allergen Reactions  . Tomato Swelling    Patient reports only allergic to RAW tomatoes.   Current Outpatient Medications on File Prior to Visit  Medication Sig Dispense  Refill  . Prenatal Vit-Fe Fumarate-FA (PRENATAL VITAMIN) 27-0.8 MG TABS Take by mouth.    . promethazine (PHENERGAN) 25 MG tablet Take 1 tablet (25 mg total) by mouth every 6 (six) hours as needed for nausea or vomiting. 30 tablet 2   No current facility-administered medications on file prior to visit.    Review of Systems Pertinent items noted in HPI and remainder of comprehensive ROS otherwise negative.  Physical Exam:   Vitals:   10/16/20 1428  BP: 113/61  Pulse: 90  Weight: 123 lb (55.8 kg)     Bedside Ultrasound for FHR check: Viable intrauterine pregnancy with positive cardiac activity noted  study. General: well-developed, well-nourished female in no acute distress  Breasts:  normal appearance, no masses or tenderness bilaterally  Skin: normal coloration and turgor, no rashes  Neurologic: oriented, normal, negative, normal mood  Extremities: normal strength, tone, and muscle mass, ROM of all joints is normal  HEENT PERRLA, extraocular movement intact and sclera clear, anicteric  Neck supple and no masses  Cardiovascular: regular rate and rhythm  Respiratory:  no respiratory distress, normal breath sounds  Abdomen: soft, non-tender; bowel sounds normal; no masses,  no organomegaly  Pelvic: normal external genitalia, no lesions, normal vaginal mucosa, normal vaginal discharge, normal cervix, *pap smear done. Friable cervix noted.     Assessment:    Pregnancy: G2P0101 Patient Active Problem List   Diagnosis Date Noted  . Vaginal yeast infection 10/16/2020  . Encounter for supervision of normal pregnancy in first trimester 10/10/2020  . Premature rupture of membranes 01/21/2019     Plan:   1. Encounter for supervision of other normal pregnancy in first trimester  - Cytology - PAP( [place]) - CBC/D/Plt+RPR+Rh+ABO+Rub Ab... - Culture, OB Urine - Cervicovaginal ancillary only( [place]) - [place] MFM OB COMP + 14 WK; Future  2. Vaginal yeast infection  Rx:  Terazol cream x 7 days.    3. Preterm premature rupture of membranes, unspecified duration to onset of labor  PPROM @ 36.5, delivery at 36.6   Initial labs drawn. Continue prenatal vitamins. Problem list reviewed and updated. Genetic Screening discussed, NIPS: requested. Ultrasound discussed; fetal anatomic survey: requested. Anticipatory guidance about prenatal visits given including labs, ultrasounds, and testing. Discussed usage of Babyscripts and virtual visits as additional source of managing and completing prenatal visits in midst of coronavirus and pandemic.   Encouraged to complete MyChart Registration for her ability to review results, send requests, and have questions addressed.  The nature of [place] [facility]/Faculty Practice with multiple MDs and Advanced Practice Providers was explained to patient; also emphasized that residents, students are part of our team. Routine obstetric precautions reviewed. Encouraged to seek out care at office or emergency room [facility] preferred) for urgent and/or emergent concerns. Return in about 4 weeks (around 11/13/2020).    Biochemist, clinical for [employer], [facility]

## 2020-10-17 LAB — CERVICOVAGINAL ANCILLARY ONLY
Candida Glabrata: NEGATIVE
Candida Vaginitis: POSITIVE — AB
Chlamydia: NEGATIVE
Comment: NEGATIVE
Comment: NEGATIVE
Comment: NEGATIVE
Comment: NEGATIVE
Comment: NORMAL
Neisseria Gonorrhea: NEGATIVE
Trichomonas: NEGATIVE

## 2020-10-17 LAB — CYTOLOGY - PAP
Comment: NEGATIVE
Diagnosis: NEGATIVE
High risk HPV: NEGATIVE

## 2020-10-18 LAB — CBC/D/PLT+RPR+RH+ABO+RUB AB...
Antibody Screen: NEGATIVE
Basophils Absolute: 0.1 10*3/uL (ref 0.0–0.2)
Basos: 0 %
EOS (ABSOLUTE): 0.1 10*3/uL (ref 0.0–0.4)
Eos: 1 %
HCV Ab: <0.1 s/co ratio (ref 0.0–0.9)
HIV Screen 4th Generation wRfx: NONREACTIVE
Hematocrit: 38.7 % (ref 34.0–46.6)
Hemoglobin: 13.6 g/dL (ref 11.1–15.9)
Hepatitis B Surface Ag: NEGATIVE
Immature Grans (Abs): 0.1 10*3/uL (ref 0.0–0.1)
Immature Granulocytes: 1 %
Lymphocytes Absolute: 1.8 10*3/uL (ref 0.7–3.1)
Lymphs: 13 %
MCH: 33.3 pg — ABNORMAL HIGH (ref 26.6–33.0)
MCHC: 35.1 g/dL (ref 31.5–35.7)
MCV: 95 fL (ref 79–97)
Monocytes Absolute: 0.8 10*3/uL (ref 0.1–0.9)
Monocytes: 6 %
Neutrophils Absolute: 11.6 10*3/uL — ABNORMAL HIGH (ref 1.4–7.0)
Neutrophils: 79 %
Platelets: 314 10*3/uL (ref 150–450)
RBC: 4.08 x10E6/uL (ref 3.77–5.28)
RDW: 12.3 % (ref 11.7–15.4)
RPR Ser Ql: NONREACTIVE
Rh Factor: POSITIVE
Rubella Antibodies, IGG: 14.5 index (ref >0.99)
WBC: 14.5 10*3/uL — ABNORMAL HIGH (ref 3.4–10.8)

## 2020-10-18 LAB — HCV INTERPRETATION

## 2020-10-19 LAB — CULTURE, OB URINE

## 2020-10-19 LAB — URINE CULTURE, OB REFLEX

## 2020-10-24 ENCOUNTER — Encounter: Payer: Self-pay | Admitting: Obstetrics and Gynecology

## 2020-10-25 DIAGNOSIS — Z419 Encounter for procedure for purposes other than remedying health state, unspecified: Secondary | ICD-10-CM | POA: Diagnosis not present

## 2020-11-13 ENCOUNTER — Other Ambulatory Visit: Payer: Self-pay

## 2020-11-13 ENCOUNTER — Ambulatory Visit (INDEPENDENT_AMBULATORY_CARE_PROVIDER_SITE_OTHER): Payer: Medicaid Other

## 2020-11-13 VITALS — BP 100/69 | HR 71 | Wt 125.0 lb

## 2020-11-13 DIAGNOSIS — Z3A15 15 weeks gestation of pregnancy: Secondary | ICD-10-CM | POA: Diagnosis not present

## 2020-11-13 DIAGNOSIS — O219 Vomiting of pregnancy, unspecified: Secondary | ICD-10-CM

## 2020-11-13 DIAGNOSIS — Z3480 Encounter for supervision of other normal pregnancy, unspecified trimester: Secondary | ICD-10-CM | POA: Diagnosis not present

## 2020-11-13 MED ORDER — ONDANSETRON 4 MG PO TBDP
4.0000 mg | ORAL_TABLET | Freq: Three times a day (TID) | ORAL | 1 refills | Status: DC | PRN
Start: 2020-11-13 — End: 2021-02-16

## 2020-11-13 NOTE — Progress Notes (Signed)
   LOW-RISK PREGNANCY OFFICE VISIT  Patient name: Charlotte Ward MRN [id number]  Date of birth: September 07, 2000 Chief Complaint:   Routine Prenatal Visit  Subjective:   Charlotte Ward is a 20 y.o. G86P0101 female at [redacted]w[redacted]d with an Estimated Date of Delivery: 05/03/21 being seen today for ongoing management of a low-risk pregnancy aeb has Premature rupture of membranes; Encounter for supervision of normal pregnancy in first trimester; and Vaginal yeast infection on their problem list.  Patient presents today with no complaints. Patient denies abdominal cramping or contractions.  Patient denies vaginal concerns including abnormal discharge, leaking of fluid, and bleeding. She reports having the taste of vomit in her throat for at least a day after vomiting.  She states it continues despite eating and drinking.  Contractions: Not present. Vag. Bleeding: None.  Movement: Absent.  Reviewed past medical,surgical, social, obstetrical and family history as well as problem list, medications and allergies.  Objective   Vitals:   11/13/20 1359  BP: 100/69  Pulse: 71  Weight: 125 lb (56.7 kg)  Body mass index is 19.58 kg/m.  Total Weight Gain:10 lb (4.536 kg)         Physical Examination:   General appearance: Well appearing, and in no distress  Mental status: Alert, oriented to person, place, and time  Skin: Warm & dry  Cardiovascular: Normal heart rate noted  Respiratory: Normal respiratory effort, no distress  Abdomen: Soft, gravid, nontender, AGA with Fundus at U/-3  Pelvic: Cervical exam deferred           Extremities: Edema: None  Fetal Status: Fetal Heart Rate (bpm): 148  Movement: Absent   No results found for this or any previous visit (from the past 24 hour(s)).  Assessment & Plan:  Low-risk pregnancy of a 20 y.o., G2P0101 at [redacted]w[redacted]d with an Estimated Date of Delivery: 05/03/21   1. Supervision of other normal pregnancy, antepartum -Anticipatory guidance for upcoming appts. -Patient to next  appt in 5 weeks for an in-person visit. -Anatomy US scheduled.  Informed of time and date.  Instructed to arrive 15 min early.  2. [redacted] weeks gestation of pregnancy -Doing well overall. -AFP today.   3. Nausea in Pregnancy -Discussed c/o continued "vomit taste" after emesis. -Discussed usage of hard candy, salt water gargle, and increasing water intake. -Will prescribe Zofran for nausea and patient reports phenergan causes drowsiness and she has to work.       Meds: No orders of the defined types were placed in this encounter.  Labs/procedures today:  Lab Orders  No laboratory test(s) ordered today     Reviewed: Preterm labor symptoms and general obstetric precautions including but not limited to vaginal bleeding, contractions, leaking of fluid and fetal movement were reviewed in detail with the patient.  All questions were answered.  Follow-up: No follow-ups on file.  No orders of the defined types were placed in this encounter.  Cherre Robins MSN, CNM 11/13/2020

## 2020-11-13 NOTE — Progress Notes (Signed)
ROB [redacted]w[redacted]d  AFP today  CC: None

## 2020-11-13 NOTE — Progress Notes (Deleted)
 ***-  RISK PREGNANCY OFFICE VISIT  Patient name: Charlotte Ward MRN [id number]  Date of birth: 10/22/2000 Chief Complaint:   Routine Prenatal Visit  Subjective:   [name] is a 20 y.o. G37P0101 female at [redacted]w[redacted]d with an Estimated Date of Delivery: 05/03/21 being seen today for ongoing management of a ***-risk pregnancy aeb has Premature rupture of membranes; Encounter for supervision of normal pregnancy in first trimester; and Vaginal yeast infection on their problem list.  Patient presents today with {sx:14538}.  Patient endorses fetal movement. Patient denies abdominal cramping or contractions.  Patient {Actions; denies-reports:120008} vaginal concerns including abnormal discharge, leaking of fluid, and bleeding.  Contractions: Not present. Vag. Bleeding: None.  Movement: Absent.  Reviewed past medical,surgical, social, obstetrical and family history as well as problem list, medications and allergies.  Objective   Vitals:   11/13/20 1359  BP: 100/69  Pulse: 71  Weight: 125 lb (56.7 kg)  Body mass index is 19.58 kg/m.  Total Weight Gain:10 lb (4.536 kg)         Physical Examination:   General appearance: Well appearing, and in no distress  Mental status: Alert, oriented to person, place, and time  Skin: Warm & dry  Cardiovascular: Normal heart rate noted  Respiratory: Normal respiratory effort, no distress  Abdomen: Soft, gravid, nontender, ***GA with    Pelvic: {Blank single:19197::"Cervical exam performed","Cervical exam deferred"}           Extremities: Edema: None  Fetal Status: Fetal Heart Rate (bpm): 148  Movement: Absent   No results found for this or any previous visit (from the past 24 hour(s)).  Assessment & Plan:  ***-risk pregnancy of a 20 y.o., G2P0101 at [redacted]w[redacted]d with an Estimated Date of Delivery: 05/03/21   1. Supervision of other normal pregnancy, antepartum ***  2. [redacted] weeks gestation of pregnancy ***  3. Encounter for supervision of other normal pregnancy in  first trimester ***      Meds: No orders of the defined types were placed in this encounter.  Labs/procedures today:  Lab Orders  No laboratory test(s) ordered today     Reviewed: {Blank single:19197::"Term","Preterm"} labor symptoms and general obstetric precautions including but not limited to vaginal bleeding, contractions, leaking of fluid and fetal movement were reviewed in detail with the patient.  All questions were answered.  Follow-up: No follow-ups on file.  No orders of the defined types were placed in this encounter.  Cherre Robins MSN, CNM 11/13/2020

## 2020-11-15 LAB — AFP, SERUM, OPEN SPINA BIFIDA
AFP MoM: 0.65
AFP Value: 23.4 ng/mL
Gest. Age on Collection Date: 15.4 weeks
Maternal Age At EDD: 21.5 yr
OSBR Risk 1 IN: 10000
Test Results:: NEGATIVE
Weight: 125 [lb_av]

## 2020-11-24 DIAGNOSIS — Z419 Encounter for procedure for purposes other than remedying health state, unspecified: Secondary | ICD-10-CM | POA: Diagnosis not present

## 2020-12-09 ENCOUNTER — Ambulatory Visit: Payer: Medicaid Other | Attending: Obstetrics and Gynecology

## 2020-12-09 ENCOUNTER — Other Ambulatory Visit: Payer: Self-pay

## 2020-12-09 DIAGNOSIS — Z3481 Encounter for supervision of other normal pregnancy, first trimester: Secondary | ICD-10-CM | POA: Diagnosis not present

## 2020-12-18 ENCOUNTER — Ambulatory Visit (INDEPENDENT_AMBULATORY_CARE_PROVIDER_SITE_OTHER): Payer: Medicaid Other | Admitting: Women's Health

## 2020-12-18 ENCOUNTER — Other Ambulatory Visit: Payer: Self-pay

## 2020-12-18 VITALS — BP 97/58 | HR 76 | Wt 132.0 lb

## 2020-12-18 DIAGNOSIS — Z3480 Encounter for supervision of other normal pregnancy, unspecified trimester: Secondary | ICD-10-CM

## 2020-12-18 DIAGNOSIS — Z3A20 20 weeks gestation of pregnancy: Secondary | ICD-10-CM

## 2020-12-18 NOTE — Patient Instructions (Signed)
Maternity Assessment Unit (MAU)  The Maternity Assessment Unit (MAU) is located at the [facility] and [place] [facility]. The address is: [address], [place], [place], [place] [address]. Please see map below for additional directions.    The Maternity Assessment Unit is designed to help you during your pregnancy, and for up to 6 weeks after delivery, with any pregnancy- or postpartum-related emergencies, if you think you are in labor, or if your water has broken. For example, if you experience nausea and vomiting, vaginal bleeding, severe abdominal or pelvic pain, elevated blood pressure or other problems related to your pregnancy or postpartum time, please come to the Maternity Assessment Unit for assistance.        Preterm Labor The normal length of a pregnancy is 39-41 weeks. Preterm labor is when labor starts before 37 completed weeks of pregnancy. Babies who are born prematurely and survive may not be fully developed and may be at an increased risk for long-term problems such as cerebral palsy, developmental delays, and vision and hearing problems. Babies who are born too early may have problems soon after birth. Problems may include regulating blood sugar, body temperature, heart rate, and breathing rate. These babies often have trouble with feeding. The risk of having problems is highest for babies who are born before 44 weeks of pregnancy. What are the causes? The exact cause of this condition is not known. What increases the risk? You are more likely to have preterm labor if you have certain risk factors that relate to your medical history, problems with present and past pregnancies, and lifestyle factors. Medical history  You have abnormalities of the uterus, including a short cervix.  You have STIs (sexually transmitted infections), or other infections of the urinary tract and the vagina.  You have chronic illnesses, such as blood clotting problems,  diabetes, or high blood pressure.  You are overweight or underweight. Present and past pregnancies  You have had preterm labor before.  You are pregnant with twins or other multiples.  You have been diagnosed with a condition in which the placenta covers your cervix (placenta previa).  You waited less than 6 months between giving birth and becoming pregnant again.  Your unborn baby has some abnormalities.  You have vaginal bleeding during pregnancy.  You became pregnant through in vitro fertilization (IVF). Lifestyle and environmental factors  You use tobacco products.  You drink alcohol.  You use street drugs.  You have stress and no social support.  You experience domestic violence.  You are exposed to certain chemicals or environmental pollutants. Other factors  You are younger than age 110 or older than age 19. What are the signs or symptoms? Symptoms of this condition include:  Cramps similar to those that can happen during a menstrual period. The cramps may happen with diarrhea.  Pain in the abdomen or lower back.  Regular contractions that may feel like tightening of the abdomen.  A feeling of increased pressure in the pelvis.  Increased watery or bloody mucus discharge from the vagina.  Water breaking (ruptured amniotic sac). How is this diagnosed? This condition is diagnosed based on:  Your medical history and a physical exam.  A pelvic exam.  An ultrasound.  Monitoring your uterus for contractions.  Other tests, including: ? A swab of the cervix to check for a chemical called fetal fibronectin. ? Urine tests. How is this treated? Treatment for this condition depends on the length of your pregnancy, your  condition, and the health of your baby. Treatment may include:  Taking medicines, such as: ? Hormone medicines. These may be given early in pregnancy to help support the pregnancy. ? Medicines to stop contractions. ? Medicines to help mature  the baby's lungs. These may be prescribed if the risk of delivery is high. ? Medicines to prevent your baby from developing cerebral palsy.  Bed rest. If the labor happens before 34 weeks of pregnancy, you may need to stay in the hospital.  Delivery of the baby. Follow these instructions at home:  Do not use any products that contain nicotine or tobacco, such as cigarettes, e-cigarettes, and chewing tobacco. If you need help quitting, ask your health care provider.  Do not drink alcohol.  Take over-the-counter and prescription medicines only as told by your health care provider.  Rest as told by your health care provider.  Return to your normal activities as told by your health care provider. Ask your health care provider what activities are safe for you.  Keep all follow-up visits as told by your health care provider. This is important.   How is this prevented? To increase your chance of having a full-term pregnancy:  Do not use street drugs or medicines that have not been prescribed to you during your pregnancy.  Talk with your health care provider before taking any herbal supplements, even if you have been taking them regularly.  Make sure you gain a healthy amount of weight during your pregnancy.  Watch for infection. If you think that you might have an infection, get it checked right away. Symptoms of infection may include: ? Fever. ? Abnormal vaginal discharge or discharge that smells bad. ? Pain or burning with urination. ? Needing to urinate urgently. ? Frequently urinating or passing small amounts of urine frequently. ? Blood in your urine. ? Urine that smells bad or unusual.  Tell your health care provider if you have had preterm labor before. Contact a health care provider if:  You think you are going into preterm labor.  You have signs or symptoms of preterm labor.  You have symptoms of infection. Get help right away if:  You are having regular, painful  contractions every 5 minutes or less.  Your water breaks. Summary  Preterm labor is labor that starts before you reach 37 weeks of pregnancy.  Delivering your baby early increases your baby's risk of developing lifelong problems.  The exact cause of preterm labor is unknown. However, having an abnormal uterus, an STI (sexually transmitted infection), or vaginal bleeding during pregnancy increases your risk for preterm labor.  Keep all follow-up visits as told by your health care provider. This is important.  Contact a health care provider if you have signs or symptoms of preterm labor. This information is not intended to replace advice given to you by your health care provider. Make sure you discuss any questions you have with your health care provider. Document Revised: 08/15/2019 Document Reviewed: 08/15/2019 Elsevier Patient Education  2021 Elsevier Inc.        Second Trimester of Pregnancy  The second trimester of pregnancy is from week 13 through week 27. This is months 4 through 6 of pregnancy. The second trimester is often a time when you feel your best. Your body has adjusted to being pregnant, and you begin to feel better physically. During the second trimester:  Morning sickness has lessened or stopped completely.  You may have more energy.  You may have an increase  in appetite. The second trimester is also a time when the unborn baby (fetus) is growing rapidly. At the end of the sixth month, the fetus may be up to 12 inches long and weigh about 1 pounds. You will likely begin to feel the baby move (quickening) between 16 and 20 weeks of pregnancy. Body changes during your second trimester Your body continues to go through many changes during your second trimester. The changes vary and generally return to normal after the baby is born. Physical changes  Your weight will continue to increase. You will notice your lower abdomen bulging out.  You may begin to get  stretch marks on your hips, abdomen, and breasts.  Your breasts will continue to grow and to become tender.  Dark spots or blotches (chloasma or mask of pregnancy) may develop on your face.  A dark line from your belly button to the pubic area (linea nigra) may appear.  You may have changes in your hair. These can include thickening of your hair, rapid growth, and changes in texture. Some people also have hair loss during or after pregnancy, or hair that feels dry or thin. Health changes  You may develop headaches.  You may have heartburn.  You may develop constipation.  You may develop hemorrhoids or swollen, bulging veins (varicose veins).  Your gums may bleed and may be sensitive to brushing and flossing.  You may urinate more often because the fetus is pressing on your bladder.  You may have back pain. This is caused by: ? Weight gain. ? Pregnancy hormones that are relaxing the joints in your pelvis. ? A shift in weight and the muscles that support your balance. Follow these instructions at home: Medicines  Follow your health care provider's instructions regarding medicine use. Specific medicines may be either safe or unsafe to take during pregnancy. Do not take any medicines unless approved by your health care provider.  Take a prenatal vitamin that contains at least 600 micrograms (mcg) of folic acid. Eating and drinking  Eat a healthy diet that includes fresh fruits and vegetables, whole grains, good sources of protein such as meat, eggs, or tofu, and low-fat dairy products.  Avoid raw meat and unpasteurized juice, milk, and cheese. These carry germs that can harm you and your baby.  You may need to take these actions to prevent or treat constipation: ? Drink enough fluid to keep your urine pale yellow. ? Eat foods that are high in fiber, such as beans, whole grains, and fresh fruits and vegetables. ? Limit foods that are high in fat and processed sugars, such as  fried or sweet foods. Activity  Exercise only as directed by your health care provider. Most people can continue their usual exercise routine during pregnancy. Try to exercise for 30 minutes at least 5 days a week. Stop exercising if you develop contractions in your uterus.  Stop exercising if you develop pain or cramping in the lower abdomen or lower back.  Avoid exercising if it is very hot or humid or if you are at a high altitude.  Avoid heavy lifting.  If you choose to, you may have sex unless your health care provider tells you not to. Relieving pain and discomfort  Wear a supportive bra to prevent discomfort from breast tenderness.  Take warm sitz baths to soothe any pain or discomfort caused by hemorrhoids. Use hemorrhoid cream if your health care provider approves.  Rest with your legs raised (elevated) if you have leg  cramps or low back pain.  If you develop varicose veins: ? Wear support hose as told by your health care provider. ? Elevate your feet for 15 minutes, 3-4 times a day. ? Limit salt in your diet. Safety  Wear your seat belt at all times when driving or riding in a car.  Talk with your health care provider if someone is verbally or physically abusive to you. Lifestyle  Do not use hot tubs, steam rooms, or saunas.  Do not douche. Do not use tampons or scented sanitary pads.  Avoid cat litter boxes and soil used by cats. These carry germs that can cause birth defects in the baby and possibly loss of the fetus by miscarriage or stillbirth.  Do not use herbal remedies, alcohol, illegal drugs, or medicines that are not approved by your health care provider. Chemicals in these products can harm your baby.  Do not use any products that contain nicotine or tobacco, such as cigarettes, e-cigarettes, and chewing tobacco. If you need help quitting, ask your health care provider. General instructions  During a routine prenatal visit, your health care provider will  do a physical exam and other tests. He or she will also discuss your overall health. Keep all follow-up visits. This is important.  Ask your health care provider for a referral to a local prenatal education class.  Ask for help if you have counseling or nutritional needs during pregnancy. Your health care provider can offer advice or refer you to specialists for help with various needs. Where to find more information  American Pregnancy Association: americanpregnancy.org  [employer]: https://www.todd-brady.net/  Office on [employer] [facility]: MightyReward.co.nz Contact a health care provider if you have:  A headache that does not go away when you take medicine.  Vision changes or you see spots in front of your eyes.  Mild pelvic cramps, pelvic pressure, or nagging pain in the abdominal area.  Persistent nausea, vomiting, or diarrhea.  A bad-smelling vaginal discharge or foul-smelling urine.  Pain when you urinate.  Sudden or extreme swelling of your face, hands, ankles, feet, or legs.  A fever. Get help right away if you:  Have fluid leaking from your vagina.  Have spotting or bleeding from your vagina.  Have severe abdominal cramping or pain.  Have difficulty breathing.  Have chest pain.  Have fainting spells.  Have not felt your baby move for the time period told by your health care provider.  Have new or increased pain, swelling, or redness in an arm or leg. Summary  The second trimester of pregnancy is from week 13 through week 27 (months 4 through 6).  Do not use herbal remedies, alcohol, illegal drugs, or medicines that are not approved by your health care provider. Chemicals in these products can harm your baby.  Exercise only as directed by your health care provider. Most people can continue their usual exercise routine during pregnancy.  Keep all follow-up visits. This is important. This information  is not intended to replace advice given to you by your health care provider. Make sure you discuss any questions you have with your health care provider. Document Revised: 12/20/2019 Document Reviewed: 10/26/2019 Elsevier Patient Education  2021 Elsevier Inc.        Round Ligament Pain  The round ligament is a cord of muscle and tissue that helps support the uterus. It can become a source of pain during pregnancy if it becomes stretched or twisted as the baby grows.  The pain usually begins in the second trimester (13-28 weeks) of pregnancy, and it can come and go until the baby is delivered. It is not a serious problem, and it does not cause harm to the baby. Round ligament pain is usually a short, sharp, and pinching pain, but it can also be a dull, lingering, and aching pain. The pain is felt in the lower side of the abdomen or in the groin. It usually starts deep in the groin and moves up to the outside of the hip area. The pain may occur when you:  Suddenly change position, such as quickly going from a sitting to standing position.  Roll over in bed.  Cough or sneeze.  Do physical activity. Follow these instructions at home:  Watch your condition for any changes.  When the pain starts, relax. Then try any of these methods to help with the pain: ? Sitting down. ? Flexing your knees up to your abdomen. ? Lying on your side with one pillow under your abdomen and another pillow between your legs. ? Sitting in a warm bath for 15-20 minutes or until the pain goes away.  Take over-the-counter and prescription medicines only as told by your health care provider.  Move slowly when you sit down or stand up.  Avoid long walks if they cause pain.  Stop or reduce your physical activities if they cause pain.  Keep all follow-up visits as told by your health care provider. This is important.   Contact a health care provider if:  Your pain does not go away with treatment.  You feel  pain in your back that you did not have before.  Your medicine is not helping. Get help right away if:  You have a fever or chills.  You develop uterine contractions.  You have vaginal bleeding.  You have nausea or vomiting.  You have diarrhea.  You have pain when you urinate. Summary  Round ligament pain is felt in the lower abdomen or groin. It is usually a short, sharp, and pinching pain. It can also be a dull, lingering, and aching pain.  This pain usually begins in the second trimester (13-28 weeks). It occurs because the uterus is stretching with the growing baby, and it is not harmful to the baby.  You may notice the pain when you suddenly change position, when you cough or sneeze, or during physical activity.  Relaxing, flexing your knees to your abdomen, lying on one side, or taking a warm bath may help to get rid of the pain.  Get help from your health care provider if the pain does not go away or if you have vaginal bleeding, nausea, vomiting, diarrhea, or painful urination. This information is not intended to replace advice given to you by your health care provider. Make sure you discuss any questions you have with your health care provider. Document Revised: 12/29/2017 Document Reviewed: 12/29/2017 Elsevier Patient Education  2021 [employer].

## 2020-12-18 NOTE — Progress Notes (Signed)
Subjective:  Charlotte Ward is a 20 y.o. G2P0101 at [redacted]w[redacted]d being seen today for ongoing prenatal care.  She is currently monitored for the following issues for this low-risk pregnancy and has History of PROM (premature rupture of membranes), currently pregnant and Supervision of other normal pregnancy, antepartum on their problem list.  Patient reports no complaints.  Contractions: Not present. Vag. Bleeding: None.  Movement: Present. Denies leaking of fluid.   The following portions of the patient's history were reviewed and updated as appropriate: allergies, current medications, past family history, past medical history, past social history, past surgical history and problem list. Problem list updated.  Objective:   Vitals:   12/18/20 1517  BP: (!) 97/58  Pulse: 76  Weight: 132 lb (59.9 kg)    Fetal Status: Fetal Heart Rate (bpm): 152   Movement: Present     General:  Alert, oriented and cooperative. Patient is in no acute distress.  Skin: Skin is warm and dry. No rash noted.   Cardiovascular: Normal heart rate noted  Respiratory: Normal respiratory effort, no problems with respiration noted  Abdomen: Soft, gravid, appropriate for gestational age. Pain/Pressure: Absent     Pelvic: Vag. Bleeding: None     Cervical exam deferred        Extremities: Normal range of motion.  Edema: None  Mental Status: Normal mood and affect. Normal behavior. Normal judgment and thought content.   Urinalysis:      Assessment and Plan:  Pregnancy: G2P0101 at [redacted]w[redacted]d  1. Supervision of other normal pregnancy, antepartum -CBE info given  2. [redacted] weeks gestation of pregnancy  Preterm labor symptoms and general obstetric precautions including but not limited to vaginal bleeding, contractions, leaking of fluid and fetal movement were reviewed in detail with the patient. I discussed the assessment and treatment plan with the patient. The patient was provided an opportunity to ask questions and all were  answered. The patient agreed with the plan and demonstrated an understanding of the instructions. The patient was advised to call back or seek an in-person office evaluation/go to MAU at [facility] for any urgent or concerning symptoms. Please refer to After Visit Summary for other counseling recommendations.  Return in about 4 weeks (around 01/15/2021) for in-person LOB/APP OK.   Yamir Carignan, Odie Sera, NP

## 2020-12-25 DIAGNOSIS — Z419 Encounter for procedure for purposes other than remedying health state, unspecified: Secondary | ICD-10-CM | POA: Diagnosis not present

## 2021-01-15 ENCOUNTER — Ambulatory Visit (INDEPENDENT_AMBULATORY_CARE_PROVIDER_SITE_OTHER): Payer: Medicaid Other | Admitting: Obstetrics

## 2021-01-15 ENCOUNTER — Other Ambulatory Visit: Payer: Self-pay

## 2021-01-15 ENCOUNTER — Encounter: Payer: Self-pay | Admitting: Obstetrics

## 2021-01-15 DIAGNOSIS — Z3480 Encounter for supervision of other normal pregnancy, unspecified trimester: Secondary | ICD-10-CM

## 2021-01-15 NOTE — Progress Notes (Signed)
Subjective:  Charlotte Ward is a 19 y.o. G2P0101 at [redacted]w[redacted]d being seen today for ongoing prenatal care.  She is currently monitored for the following issues for this low-risk pregnancy and has History of PROM (premature rupture of membranes), currently pregnant and Supervision of other normal pregnancy, antepartum on their problem list.  Patient reports no complaints.  Contractions: Not present. Vag. Bleeding: None.  Movement: Present. Denies leaking of fluid.   The following portions of the patient's history were reviewed and updated as appropriate: allergies, current medications, past family history, past medical history, past social history, past surgical history and problem list. Problem list updated.  Objective:   Vitals:   01/15/21 1501  BP: 116/62  Pulse: 97  Weight: 140 lb (63.5 kg)    Fetal Status:     Movement: Present     General:  Alert, oriented and cooperative. Patient is in no acute distress.  Skin: Skin is warm and dry. No rash noted.   Cardiovascular: Normal heart rate noted  Respiratory: Normal respiratory effort, no problems with respiration noted  Abdomen: Soft, gravid, appropriate for gestational age. Pain/Pressure: Absent     Pelvic:  Cervical exam deferred        Extremities: Normal range of motion.  Edema: None  Mental Status: Normal mood and affect. Normal behavior. Normal judgment and thought content.   Urinalysis:      Assessment and Plan:  Pregnancy: G2P0101 at [redacted]w[redacted]d  1. Supervision of other normal pregnancy, antepartum   Preterm labor symptoms and general obstetric precautions including but not limited to vaginal bleeding, contractions, leaking of fluid and fetal movement were reviewed in detail with the patient. Please refer to After Visit Summary for other counseling recommendations.   Return in about 4 weeks (around 02/12/2021) for ROB, 2 hour OGTT.   Brock Bad, MD  01/15/21

## 2021-01-15 NOTE — Progress Notes (Signed)
Pt reports fetal movement, denies pain.  

## 2021-01-24 DIAGNOSIS — Z419 Encounter for procedure for purposes other than remedying health state, unspecified: Secondary | ICD-10-CM | POA: Diagnosis not present

## 2021-02-12 ENCOUNTER — Ambulatory Visit (INDEPENDENT_AMBULATORY_CARE_PROVIDER_SITE_OTHER): Payer: Medicaid Other | Admitting: Obstetrics

## 2021-02-12 ENCOUNTER — Other Ambulatory Visit: Payer: Medicaid Other

## 2021-02-12 ENCOUNTER — Other Ambulatory Visit: Payer: Self-pay

## 2021-02-12 ENCOUNTER — Encounter: Payer: Self-pay | Admitting: Obstetrics

## 2021-02-12 VITALS — BP 109/61 | HR 87 | Wt 144.0 lb

## 2021-02-12 DIAGNOSIS — Z23 Encounter for immunization: Secondary | ICD-10-CM

## 2021-02-12 DIAGNOSIS — Z3480 Encounter for supervision of other normal pregnancy, unspecified trimester: Secondary | ICD-10-CM

## 2021-02-12 DIAGNOSIS — L989 Disorder of the skin and subcutaneous tissue, unspecified: Secondary | ICD-10-CM

## 2021-02-12 NOTE — Progress Notes (Signed)
ROB 28w4  CC: pt has spot on her back does not itch and is not painful unsure what it may be.

## 2021-02-12 NOTE — Progress Notes (Signed)
Subjective:  Charlotte Ward is a 20 y.o. G2P0101 at [redacted]w[redacted]d being seen today for ongoing prenatal care.  She is currently monitored for the following issues for this low-risk pregnancy and has History of PROM (premature rupture of membranes), currently pregnant and Supervision of other normal pregnancy, antepartum on their problem list.  Patient reports  spot on back that is asymptomatic but won't go away .  Contractions: Not present. Vag. Bleeding: None.  Movement: Present. Denies leaking of fluid.   The following portions of the patient's history were reviewed and updated as appropriate: allergies, current medications, past family history, past medical history, past social history, past surgical history and problem list. Problem list updated.  Objective:   Vitals:   02/12/21 0946  BP: 109/61  Pulse: 87  Weight: 144 lb (65.3 kg)    Fetal Status:     Movement: Present     General:  Alert, oriented and cooperative. Patient is in no acute distress.  Skin: Skin is warm and dry. No rash noted.   Cardiovascular: Normal heart rate noted  Respiratory: Normal respiratory effort, no problems with respiration noted  Abdomen: Soft, gravid, appropriate for gestational age. Pain/Pressure: Absent     Pelvic:  Cervical exam deferred        Extremities: Normal range of motion.  Edema: None  Mental Status: Normal mood and affect. Normal behavior. Normal judgment and thought content.   Urinalysis:      Assessment and Plan:  Pregnancy: G2P0101 at [redacted]w[redacted]d  1. Supervision of other normal pregnancy, antepartum Rx: - Glucose Tolerance, 2 Hours w/1 Hour - CBC - HIV Antibody (routine testing w rflx) - RPR  2. Need for Tdap vaccination Rx: - Tdap vaccine greater than or equal to 7yo IM  3. Skin lesion of back - pigmented, asymptomatic - will refer to Dermatologist if there is no resolution before next appointment in 2 weeks    Preterm labor symptoms and general obstetric precautions including but not  limited to vaginal bleeding, contractions, leaking of fluid and fetal movement were reviewed in detail with the patient. Please refer to After Visit Summary for other counseling recommendations.   Return in about 2 weeks (around 02/26/2021) for ROB.   Brock Bad, MD  02/12/21

## 2021-02-13 LAB — CBC
Hematocrit: 28.7 % — ABNORMAL LOW (ref 34.0–46.6)
Hemoglobin: 9.6 g/dL — ABNORMAL LOW (ref 11.1–15.9)
MCH: 33 pg (ref 26.6–33.0)
MCHC: 33.4 g/dL (ref 31.5–35.7)
MCV: 99 fL — ABNORMAL HIGH (ref 79–97)
Platelets: 267 10*3/uL (ref 150–450)
RBC: 2.91 x10E6/uL — ABNORMAL LOW (ref 3.77–5.28)
RDW: 12.3 % (ref 11.7–15.4)
WBC: 9.5 10*3/uL (ref 3.4–10.8)

## 2021-02-13 LAB — GLUCOSE TOLERANCE, 2 HOURS W/ 1HR
Glucose, 1 hour: 107 mg/dL (ref 65–179)
Glucose, 2 hour: 58 mg/dL — ABNORMAL LOW (ref 65–152)
Glucose, Fasting: 73 mg/dL (ref 65–91)

## 2021-02-13 LAB — HIV ANTIBODY (ROUTINE TESTING W REFLEX): HIV Screen 4th Generation wRfx: NONREACTIVE

## 2021-02-13 LAB — RPR: RPR Ser Ql: NONREACTIVE

## 2021-02-16 ENCOUNTER — Other Ambulatory Visit: Payer: Self-pay

## 2021-02-16 ENCOUNTER — Encounter (HOSPITAL_COMMUNITY): Payer: Self-pay

## 2021-02-16 ENCOUNTER — Ambulatory Visit (HOSPITAL_COMMUNITY)
Admission: EM | Admit: 2021-02-16 | Discharge: 2021-02-16 | Disposition: A | Discharge status: Home / Self Care | Payer: Medicaid Other

## 2021-02-16 DIAGNOSIS — R21 Rash and other nonspecific skin eruption: Secondary | ICD-10-CM

## 2021-02-16 MED ORDER — NYSTATIN 100000 UNIT/GM EX CREA: TOPICAL_CREAM | CUTANEOUS | 0 refills | Status: DC

## 2021-02-16 NOTE — Discharge Instructions (Addendum)
Apply the nystatin cream twice a day.    Follow up with dermatology as soon as possible for re-evaluation.

## 2021-02-16 NOTE — ED Triage Notes (Signed)
Pt present a rash on her right side of her upper back area near shoulder, pt states she noticed the area 3 months ago. Pt states it does not bother her but she just wanted to know what it is.

## 2021-02-16 NOTE — ED Provider Notes (Signed)
[facility]    CSN: [id number] Arrival date & time: 02/16/21  1115      History   Chief Complaint Chief Complaint  Patient presents with   Rash    HPI Charlotte Ward is a 20 y.o. female.   Patient here for evaluation of lesion to the right upper back/shoulder that has been present for several months.  Denies any itching or pain.  Reports that the lesion is the same size as when she first noticed it.  Has not tried any OTC medications or treatments. Denies any trauma, injury, or other precipitating event.  Denies any specific alleviating or aggravating factors.  Denies any fevers, chest pain, shortness of breath, N/V/D, numbness, tingling, weakness, abdominal pain, or headaches.     The history is provided by the patient.  Rash  Past Medical History:  Diagnosis Date   Anxiety    Supervision of normal first teen pregnancy in second trimester 09/26/2018   BabyRx 3/30--BP cuff given from office (left at desk for P/U) Nursing Staff Provider Office Location  [place] Dating   LMP: 05/09/2018 Language   English Anatomy US  Wnl Flu Vaccine   09/26/2018 Genetic Screen  NIPS: low risk female   AFP:   TDaP vaccine   info given 11-21-18 Hgb A1C or  GTT Early  Third trimester : 2 hour wnl  Component     Latest Ref Rng & Units 11/21/2018 Glucose, Fasting     65 -    Patient Active Problem List   Diagnosis Date Noted   Supervision of other normal pregnancy, antepartum 10/10/2020   History of PROM (premature rupture of membranes), currently pregnant 01/21/2019    History reviewed. No pertinent surgical history.  OB History     Gravida  2   Para  1   Term      Preterm  1   AB      Living  1      SAB      IAB      Ectopic      Multiple  0   Live Births  1            Home Medications    Prior to Admission medications   Medication Sig Start Date End Date Taking? Authorizing Provider  nystatin cream (MYCOSTATIN) Apply to affected area 2 times daily 02/16/21  Yes  Ivette Loyal, NP  Prenatal Vit-Fe Fumarate-FA (PRENATAL VITAMIN) 27-0.8 MG TABS Take by mouth.    [provider]  triamcinolone cream (KENALOG) 0.1 % APPLY TO AFFECTED AREA TWICE A DAY 11/19/20   [provider]    Family History Family History  Problem Relation Age of Onset   Asthma Mother    Migraines Mother     Social History Social History   Tobacco Use   Smoking status: Former    Packs/day: 0.25    Types: Cigars, Cigarettes    Quit date: 10/08/2016    Years since quitting: 4.3   Smokeless tobacco: Never  Vaping Use   Vaping Use: Former   Devices: Not since confirmed pregnancy  Substance Use Topics   Alcohol use: No   Drug use: No     Allergies   Tomato   Review of Systems Review of Systems  Skin:  Positive for rash.  All other systems reviewed and are negative.   Physical Exam Triage Vital Signs ED Triage Vitals  Enc Vitals Group     BP 02/16/21 1239  108/63     Pulse Rate 02/16/21 1239 82     Resp 02/16/21 1239 16     Temp 02/16/21 1239 98.3 F (36.8 C)     Temp Source 02/16/21 1239 Oral     SpO2 02/16/21 1239 100 %     Weight --      Height --      Head Circumference --      Peak Flow --      Pain Score 02/16/21 1241 0     Pain Loc --      Pain Edu? --      Excl. in GC? --    No data found.  Updated Vital Signs BP 108/63 (BP Location: Left Arm)   Pulse 82   Temp 98.3 F (36.8 C) (Oral)   Resp 16   LMP 07/27/2020   SpO2 100%   Visual Acuity Right Eye Distance:   Left Eye Distance:   Bilateral Distance:    Right Eye Near:   Left Eye Near:    Bilateral Near:     Physical Exam Vitals and nursing note reviewed.  Constitutional:      General: She is not in acute distress.    Appearance: Normal appearance. She is not ill-appearing, toxic-appearing or diaphoretic.  HENT:     Head: Normocephalic and atraumatic.  Eyes:     Conjunctiva/sclera: Conjunctivae normal.  Cardiovascular:     Rate and Rhythm: Normal  rate.     Pulses: Normal pulses.  Pulmonary:     Effort: Pulmonary effort is normal.  Abdominal:     General: Abdomen is flat.  Musculoskeletal:        General: Normal range of motion.     Cervical back: Normal range of motion.  Skin:    General: Skin is warm and dry.     Findings: Lesion (see photo) present.  Neurological:     General: No focal deficit present.     Mental Status: She is alert and oriented to person, place, and time.  Psychiatric:        Mood and Affect: Mood normal.      UC Treatments / Results  Labs (all labs ordered are listed, but only abnormal results are displayed) Labs Reviewed - No data to display  EKG   Radiology No results found.  Procedures Procedures (including critical care time)  Medications Ordered in UC Medications - No data to display  Initial Impression / Assessment and Plan / UC Course  I have reviewed the triage vital signs and the nursing notes.  Pertinent labs & imaging results that were available during my care of the patient were reviewed by me and considered in my medical decision making (see chart for details).    Assessment negative for red flags or concerns.  Unspecified skin eruption.  We will treat with nystatin cream twice a day.  Recommend following up with dermatology for reevaluation. Final Clinical Impressions(s) / UC Diagnoses   Final diagnoses:  Rash and nonspecific skin eruption     Discharge Instructions      Apply the nystatin cream twice a day.    Follow up with dermatology as soon as possible for re-evaluation.      ED Prescriptions     Medication Sig Dispense Auth. Provider   nystatin cream (MYCOSTATIN) Apply to affected area 2 times daily 30 g Ivette Loyal, NP      PDMP not reviewed this encounter.   Ivette Loyal, NP  02/16/21 1320  

## 2021-02-17 ENCOUNTER — Other Ambulatory Visit: Payer: Self-pay | Admitting: Obstetrics

## 2021-02-17 DIAGNOSIS — D508 Other iron deficiency anemias: Secondary | ICD-10-CM

## 2021-02-17 MED ORDER — FERROUS SULFATE 325 (65 FE) MG PO TABS: 325.0000 mg | ORAL_TABLET | ORAL | 5 refills | Status: DC

## 2021-02-24 DIAGNOSIS — Z419 Encounter for procedure for purposes other than remedying health state, unspecified: Secondary | ICD-10-CM | POA: Diagnosis not present

## 2021-02-27 ENCOUNTER — Ambulatory Visit (INDEPENDENT_AMBULATORY_CARE_PROVIDER_SITE_OTHER): Payer: Medicaid Other | Admitting: Obstetrics

## 2021-02-27 ENCOUNTER — Other Ambulatory Visit: Payer: Self-pay

## 2021-02-27 ENCOUNTER — Encounter: Payer: Self-pay | Admitting: Obstetrics

## 2021-02-27 VITALS — BP 105/59 | HR 77 | Wt 146.0 lb

## 2021-02-27 DIAGNOSIS — L989 Disorder of the skin and subcutaneous tissue, unspecified: Secondary | ICD-10-CM

## 2021-02-27 DIAGNOSIS — Z3480 Encounter for supervision of other normal pregnancy, unspecified trimester: Secondary | ICD-10-CM
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
## 2021-03-14 ENCOUNTER — Encounter: Payer: Medicaid Other | Admitting: Obstetrics

## 2021-03-19 ENCOUNTER — Encounter: Payer: Self-pay | Admitting: Obstetrics

## 2021-03-19 ENCOUNTER — Other Ambulatory Visit: Payer: Self-pay

## 2021-03-19 ENCOUNTER — Ambulatory Visit (INDEPENDENT_AMBULATORY_CARE_PROVIDER_SITE_OTHER): Payer: Medicaid Other | Admitting: Obstetrics

## 2021-03-19 VITALS — BP 102/61 | HR 108 | Wt 148.3 lb

## 2021-03-19 DIAGNOSIS — Z3480 Encounter for supervision of other normal pregnancy, unspecified trimester: Secondary | ICD-10-CM

## 2021-03-19 NOTE — Progress Notes (Signed)
Subjective:  Tamikka Pilger is a 20 y.o. G2P0101 at [redacted]w[redacted]d being seen today for ongoing prenatal care.  She is currently monitored for the following issues for this low-risk pregnancy and has History of PROM (premature rupture of membranes), currently pregnant and Supervision of other normal pregnancy, antepartum on their problem list.  Patient reports no complaints.  Contractions: Not present. Vag. Bleeding: None.  Movement: Present. Denies leaking of fluid.   The following portions of the patient's history were reviewed and updated as appropriate: allergies, current medications, past family history, past medical history, past social history, past surgical history and problem list. Problem list updated.  Objective:   Vitals:   03/19/21 1142  BP: 102/61  Pulse: (!) 108  Weight: 148 lb 4.8 oz (67.3 kg)    Fetal Status: Fetal Heart Rate (bpm): 140   Movement: Present     General:  Alert, oriented and cooperative. Patient is in no acute distress.  Skin: Skin is warm and dry. No rash noted.   Cardiovascular: Normal heart rate noted  Respiratory: Normal respiratory effort, no problems with respiration noted  Abdomen: Soft, gravid, appropriate for gestational age. Pain/Pressure: Absent    FH: 30cm  Pelvic:  Cervical exam deferred        Extremities: Normal range of motion.  Edema: None  Mental Status: Normal mood and affect. Normal behavior. Normal judgment and thought content.   Urinalysis:      Assessment and Plan:  Pregnancy: G2P0101 at [redacted]w[redacted]d  1. Supervision of other normal pregnancy, antepartum  2. SGA (small for gestational age) Rx: - [place] MFM OB FOLLOW UP; Future   Preterm labor symptoms and general obstetric precautions including but not limited to vaginal bleeding, contractions, leaking of fluid and fetal movement were reviewed in detail with the patient. Please refer to After Visit Summary for other counseling recommendations.   Return in about 2 weeks (around 04/02/2021) for  ROB.   Brock Bad, MD  03/19/21

## 2021-03-19 NOTE — Progress Notes (Signed)
Reports feet hurting more now. Reports round ligament type pain. Advised to get pregnancy support band. Also reports leg cramps at night.

## 2021-03-26 ENCOUNTER — Other Ambulatory Visit: Payer: Self-pay

## 2021-03-26 ENCOUNTER — Ambulatory Visit: Payer: Medicaid Other | Admitting: *Deleted

## 2021-03-26 ENCOUNTER — Ambulatory Visit: Payer: Medicaid Other | Attending: Obstetrics

## 2021-03-26 VITALS — BP 110/56 | HR 98

## 2021-03-26 DIAGNOSIS — Z362 Encounter for other antenatal screening follow-up: Secondary | ICD-10-CM

## 2021-03-26 DIAGNOSIS — O26843 Uterine size-date discrepancy, third trimester: Secondary | ICD-10-CM | POA: Diagnosis not present

## 2021-03-26 DIAGNOSIS — Z3A34 34 weeks gestation of pregnancy: Secondary | ICD-10-CM

## 2021-03-26 DIAGNOSIS — Z3480 Encounter for supervision of other normal pregnancy, unspecified trimester: Secondary | ICD-10-CM | POA: Diagnosis not present

## 2021-03-26 DIAGNOSIS — O09293 Supervision of pregnancy with other poor reproductive or obstetric history, third trimester: Secondary | ICD-10-CM

## 2021-03-27 ENCOUNTER — Other Ambulatory Visit: Payer: Self-pay | Admitting: *Deleted

## 2021-03-27 DIAGNOSIS — Z419 Encounter for procedure for purposes other than remedying health state, unspecified: Secondary | ICD-10-CM | POA: Diagnosis not present

## 2021-03-27 DIAGNOSIS — O09893 Supervision of other high risk pregnancies, third trimester: Secondary | ICD-10-CM

## 2021-04-01 ENCOUNTER — Ambulatory Visit (INDEPENDENT_AMBULATORY_CARE_PROVIDER_SITE_OTHER): Payer: Medicaid Other | Admitting: Obstetrics & Gynecology

## 2021-04-01 ENCOUNTER — Other Ambulatory Visit: Payer: Self-pay

## 2021-04-01 VITALS — BP 110/63 | HR 93 | Wt 152.8 lb

## 2021-04-01 DIAGNOSIS — Z3480 Encounter for supervision of other normal pregnancy, unspecified trimester: Secondary | ICD-10-CM

## 2021-04-01 DIAGNOSIS — O09293 Supervision of pregnancy with other poor reproductive or obstetric history, third trimester: Secondary | ICD-10-CM

## 2021-04-01 NOTE — Progress Notes (Signed)
Pt reports fetal movement with occasional cramping. 

## 2021-04-01 NOTE — Progress Notes (Signed)
   PRENATAL VISIT NOTE  Subjective:  Charlotte Ward is a 20 y.o. G2P0101 at [redacted]w[redacted]d being seen today for ongoing prenatal care.  She is currently monitored for the following issues for this high-risk pregnancy and has History of PROM (premature rupture of membranes), currently pregnant and Supervision of other normal pregnancy, antepartum on their problem list.  Patient reports occasional contractions.  Contractions: Irritability. Vag. Bleeding: None.  Movement: Present. Denies leaking of fluid.   The following portions of the patient's history were reviewed and updated as appropriate: allergies, current medications, past family history, past medical history, past social history, past surgical history and problem list.   Objective:   Vitals:   04/01/21 1106  BP: 110/63  Pulse: 93  Weight: 152 lb 12.8 oz (69.3 kg)    Fetal Status: Fetal Heart Rate (bpm): 146   Movement: Present     General:  Alert, oriented and cooperative. Patient is in no acute distress.  Skin: Skin is warm and dry. No rash noted.   Cardiovascular: Normal heart rate noted  Respiratory: Normal respiratory effort, no problems with respiration noted  Abdomen: Soft, gravid, appropriate for gestational age.  Pain/Pressure: Absent     Pelvic: Cervical exam deferred        Extremities: Normal range of motion.  Edema: None  Mental Status: Normal mood and affect. Normal behavior. Normal judgment and thought content.   Assessment and Plan:  Pregnancy: G2P0101 at [redacted]w[redacted]d 1. Supervision of other normal pregnancy, antepartum US showed potential macrosomina  2. History of premature rupture of membranes in previous pregnancy, currently pregnant in third trimester   Preterm labor symptoms and general obstetric precautions including but not limited to vaginal bleeding, contractions, leaking of fluid and fetal movement were reviewed in detail with the patient. Please refer to After Visit Summary for other counseling recommendations.    Return in about 1 week (around 04/08/2021) for GBS.  Future Appointments  Date Time Provider Department Center  04/23/2021  3:30 PM [facility] NURSE [facility]  04/23/2021  3:45 PM [facility]6 [facility]    Scheryl Darter, MD

## 2021-04-10 ENCOUNTER — Other Ambulatory Visit (HOSPITAL_COMMUNITY)
Admission: RE | Admit: 2021-04-10 | Discharge: 2021-04-10 | Disposition: A | Discharge status: Home / Self Care | Payer: Medicaid Other | Source: Ambulatory Visit | Attending: Obstetrics & Gynecology | Admitting: Obstetrics & Gynecology

## 2021-04-10 ENCOUNTER — Other Ambulatory Visit: Payer: Self-pay

## 2021-04-10 ENCOUNTER — Ambulatory Visit (INDEPENDENT_AMBULATORY_CARE_PROVIDER_SITE_OTHER): Payer: Medicaid Other | Admitting: Obstetrics & Gynecology

## 2021-04-10 VITALS — BP 114/68 | HR 92 | Wt 151.0 lb

## 2021-04-10 DIAGNOSIS — Z3480 Encounter for supervision of other normal pregnancy, unspecified trimester: Secondary | ICD-10-CM | POA: Diagnosis not present

## 2021-04-10 DIAGNOSIS — O09293 Supervision of pregnancy with other poor reproductive or obstetric history, third trimester: Secondary | ICD-10-CM

## 2021-04-10 NOTE — Progress Notes (Signed)
+   Fetal movement. No complaints.  

## 2021-04-10 NOTE — Progress Notes (Signed)
   PRENATAL VISIT NOTE  Subjective:  Charlotte Ward is a 20 y.o. G2P0101 at 102w5d being seen today for ongoing prenatal care.  She is currently monitored for the following issues for this high-risk pregnancy and has History of PROM (premature rupture of membranes), currently pregnant and Supervision of other normal pregnancy, antepartum on their problem list.  Patient reports occasional contractions.  Contractions: Irritability. Vag. Bleeding: None.  Movement: Present. Denies leaking of fluid.   The following portions of the patient's history were reviewed and updated as appropriate: allergies, current medications, past family history, past medical history, past social history, past surgical history and problem list.   Objective:   Vitals:   04/10/21 1041  BP: 114/68  Pulse: 92  Weight: 151 lb (68.5 kg)    Fetal Status: Fetal Heart Rate (bpm): 140 Fundal Height: 35 cm Movement: Present  Presentation: Vertex  General:  Alert, oriented and cooperative. Patient is in no acute distress.  Skin: Skin is warm and dry. No rash noted.   Cardiovascular: Normal heart rate noted  Respiratory: Normal respiratory effort, no problems with respiration noted  Abdomen: Soft, gravid, appropriate for gestational age.  Pain/Pressure: Absent     Pelvic: Cervical exam performed in the presence of a chaperone Dilation: Fingertip Effacement (%): 30 Station: -3  Extremities: Normal range of motion.  Edema: Trace  Mental Status: Normal mood and affect. Normal behavior. Normal judgment and thought content.   Assessment and Plan:  Pregnancy: G2P0101 at [redacted]w[redacted]d 1. Supervision of other normal pregnancy, antepartum routine test  2. History of premature rupture of membranes in previous pregnancy, currently pregnant in third trimester 36-6 in 2020  Preterm labor symptoms and general obstetric precautions including but not limited to vaginal bleeding, contractions, leaking of fluid and fetal movement were reviewed in  detail with the patient. Please refer to After Visit Summary for other counseling recommendations.   Return in about 1 week (around 04/17/2021).  Future Appointments  Date Time Provider Department Center  04/23/2021  3:30 PM [facility] NURSE [facility]  04/23/2021  3:45 PM [facility]6 [facility]    Scheryl Darter, MD

## 2021-04-11 LAB — CERVICOVAGINAL ANCILLARY ONLY
Chlamydia: NEGATIVE
Comment: NEGATIVE
Comment: NEGATIVE
Comment: NORMAL
Neisseria Gonorrhea: NEGATIVE
Trichomonas: NEGATIVE

## 2021-04-12 LAB — STREP GP B NAA: Strep Gp B NAA: NEGATIVE

## 2021-04-15 ENCOUNTER — Encounter: Payer: Medicaid Other | Admitting: Obstetrics & Gynecology

## 2021-04-17 ENCOUNTER — Other Ambulatory Visit: Payer: Self-pay

## 2021-04-17 ENCOUNTER — Ambulatory Visit (INDEPENDENT_AMBULATORY_CARE_PROVIDER_SITE_OTHER): Payer: Medicaid Other | Admitting: Obstetrics & Gynecology

## 2021-04-17 VITALS — BP 104/66 | HR 84 | Wt 153.0 lb

## 2021-04-17 DIAGNOSIS — O09293 Supervision of pregnancy with other poor reproductive or obstetric history, third trimester: Secondary | ICD-10-CM

## 2021-04-17 DIAGNOSIS — Z3480 Encounter for supervision of other normal pregnancy, unspecified trimester: Secondary | ICD-10-CM

## 2021-04-17 NOTE — Progress Notes (Signed)
   PRENATAL VISIT NOTE  Subjective:  Charlotte Ward is a 20 y.o. G2P0101 at [redacted]w[redacted]d being seen today for ongoing prenatal care.  She is currently monitored for the following issues for this low-risk pregnancy and has History of PROM (premature rupture of membranes), currently pregnant and Supervision of other normal pregnancy, antepartum on their problem list.  Patient reports occasional contractions.  Contractions: Not present. Vag. Bleeding: None.  Movement: Present. Denies leaking of fluid.   The following portions of the patient's history were reviewed and updated as appropriate: allergies, current medications, past family history, past medical history, past social history, past surgical history and problem list.   Objective:   Vitals:   04/17/21 1400  BP: 104/66  Pulse: 84  Weight: 153 lb (69.4 kg)    Fetal Status: Fetal Heart Rate (bpm): 141   Movement: Present  Presentation: Vertex  General:  Alert, oriented and cooperative. Patient is in no acute distress.  Skin: Skin is warm and dry. No rash noted.   Cardiovascular: Normal heart rate noted  Respiratory: Normal respiratory effort, no problems with respiration noted  Abdomen: Soft, gravid, appropriate for gestational age.  Pain/Pressure: Present     Pelvic: Cervical exam performed in the presence of a chaperone Dilation: 1 Effacement (%): 40 Station: -3  Extremities: Normal range of motion.  Edema: Trace  Mental Status: Normal mood and affect. Normal behavior. Normal judgment and thought content.   Assessment and Plan:  Pregnancy: G2P0101 at [redacted]w[redacted]d 1. Supervision of other normal pregnancy, antepartum GBS negative  2. History of premature rupture of membranes in previous pregnancy, currently pregnant in third trimester Full term  Term labor symptoms and general obstetric precautions including but not limited to vaginal bleeding, contractions, leaking of fluid and fetal movement were reviewed in detail with the patient. Please  refer to After Visit Summary for other counseling recommendations.   Return in about 1 week (around 04/24/2021).  Future Appointments  Date Time Provider Department Center  04/23/2021  3:30 PM [facility] NURSE [facility]  04/23/2021  3:45 PM [facility]    Scheryl Darter, MD

## 2021-04-23 ENCOUNTER — Ambulatory Visit: Payer: Medicaid Other | Admitting: *Deleted

## 2021-04-23 ENCOUNTER — Ambulatory Visit: Payer: Medicaid Other | Attending: Maternal & Fetal Medicine

## 2021-04-23 ENCOUNTER — Other Ambulatory Visit: Payer: Self-pay

## 2021-04-23 VITALS — BP 126/58 | HR 90

## 2021-04-23 DIAGNOSIS — Z362 Encounter for other antenatal screening follow-up: Secondary | ICD-10-CM | POA: Diagnosis not present

## 2021-04-23 DIAGNOSIS — O26843 Uterine size-date discrepancy, third trimester: Secondary | ICD-10-CM | POA: Insufficient documentation

## 2021-04-23 DIAGNOSIS — Z3480 Encounter for supervision of other normal pregnancy, unspecified trimester: Secondary | ICD-10-CM

## 2021-04-23 DIAGNOSIS — O09893 Supervision of other high risk pregnancies, third trimester: Secondary | ICD-10-CM | POA: Insufficient documentation

## 2021-04-23 DIAGNOSIS — O09293 Supervision of pregnancy with other poor reproductive or obstetric history, third trimester: Secondary | ICD-10-CM

## 2021-04-23 DIAGNOSIS — Z3A38 38 weeks gestation of pregnancy: Secondary | ICD-10-CM | POA: Diagnosis not present

## 2021-04-24 ENCOUNTER — Ambulatory Visit (INDEPENDENT_AMBULATORY_CARE_PROVIDER_SITE_OTHER): Payer: Medicaid Other | Admitting: Obstetrics

## 2021-04-24 ENCOUNTER — Encounter: Payer: Self-pay | Admitting: Obstetrics

## 2021-04-24 VITALS — BP 112/66 | HR 76 | Wt 153.0 lb

## 2021-04-24 DIAGNOSIS — Z3480 Encounter for supervision of other normal pregnancy, unspecified trimester: Secondary | ICD-10-CM

## 2021-04-24 DIAGNOSIS — O3660X0 Maternal care for excessive fetal growth, unspecified trimester, not applicable or unspecified: Secondary | ICD-10-CM

## 2021-04-24 NOTE — Progress Notes (Signed)
Subjective:  Charlotte Ward is a 20 y.o. G2P0101 at [redacted]w[redacted]d being seen today for ongoing prenatal care.  She is currently monitored for the following issues for this low-risk pregnancy and has History of PROM (premature rupture of membranes), currently pregnant and Supervision of other normal pregnancy, antepartum on their problem list.  Patient reports no complaints.  Contractions: Not present. Vag. Bleeding: None.  Movement: Present. Denies leaking of fluid.   The following portions of the patient's history were reviewed and updated as appropriate: allergies, current medications, past family history, past medical history, past social history, past surgical history and problem list. Problem list updated.  Objective:   Vitals:   04/24/21 1359  BP: 112/66  Pulse: 76  Weight: 153 lb (69.4 kg)    Fetal Status:     Movement: Present     General:  Alert, oriented and cooperative. Patient is in no acute distress.  Skin: Skin is warm and dry. No rash noted.   Cardiovascular: Normal heart rate noted  Respiratory: Normal respiratory effort, no problems with respiration noted  Abdomen: Soft, gravid, appropriate for gestational age. Pain/Pressure: Absent     Pelvic:  Cervical exam deferred        Extremities: Normal range of motion.     Mental Status: Normal mood and affect. Normal behavior. Normal judgment and thought content.   Urinalysis:     [place] MFM OB FOLLOW UP (Accession [id number]) (Order [id number]) Imaging Date: 04/23/2021 Department: [facility] Released By: Elesa Hacker Authorizing: Georgana Curio, MD   Exam Status  Status  Final [99]   PACS Intelerad Image Link   Show images for [place] MFM OB FOLLOW UP Study Result  Narrative & Impression  ----------------------------------------------------------------------  OBSTETRICS REPORT                       (Signed Final 04/23/2021 05:07  pm) ---------------------------------------------------------------------- Patient Info  ID #:       [id number]                          D.O.B.:  2001-06-17 (20 yrs)  Name:       Charlotte Ward                     Visit Date: 04/23/2021 03:59 pm ---------------------------------------------------------------------- Performed By  Attending:        Lin Landsman      Ref. Address:     Faculty                    MD  Performed By:     Redgie Grayer      [facility]  Referred By:      Gigi Gin                    CONSTANT MD ---------------------------------------------------------------------- Orders  #  Description                           Code        Ordered By  1  [place] MFM OB FOLLOW UP                   08657.84    Lin Landsman ----------------------------------------------------------------------  #  Order #                     Accession #                Episode #  1  [id number]                   [id number]                 [id number] ---------------------------------------------------------------------- Indications  [redacted] weeks gestation of pregnancy                Z3A.38  Encounter for other antenatal screening        Z36.2  follow-up  LR NIPS  Uterine size-date discrepancy, third trimester O26.843 ---------------------------------------------------------------------- Fetal Evaluation  Num Of Fetuses:         1  Fetal Heart Rate(bpm):  144  Cardiac Activity:       Observed  Presentation:           Cephalic  Placenta:               Anterior  P. Cord Insertion:      Previously Visualized  Amniotic Fluid  AFI FV:      Within normal limits  AFI Sum(cm)     %Tile       Largest Pocket(cm)  15.8             63          6.4  RUQ(cm)       RLQ(cm)       LUQ(cm)        LLQ(cm)  6.4           2.4           4.4            2.6 ---------------------------------------------------------------------- Biometry  BPD:      96.9  mm     G. Age:  39w 4d         94  %    CI:         77.2   %    70 - 86  FL/HC:      22.2   %    20.6 - 23.4  HC:      349.2  mm     G. Age:  40w 4d         80  %    HC/AC:      0.89        0.87 - 1.06  AC:      391.4  mm     G. Age:  43w 1d       > 99  %    FL/BPD:     80.1   %    71 - 87  FL:       77.6  mm     G. Age:  39w 5d         81  %    FL/AC:      19.8   %    20 - 24  LV:        7.9  mm  Est. FW:    4486  gm    9 lb 14 oz    > 99  % ---------------------------------------------------------------------- OB History  Gravidity:    2         Term:   1        Prem:   0        SAB:   0  TOP:          0       Ectopic:  0        Living: 1 ---------------------------------------------------------------------- Gestational Age  LMP:           38w 4d        Date:  07/27/20                 EDD:   05/03/21  U/S Today:     40w 5d                                        EDD:   04/18/21  Best:          38w 4d     Det. By:  LMP  (07/27/20)          EDD:   05/03/21 ---------------------------------------------------------------------- Anatomy  Cranium:               Appears normal         Aortic Arch:            Previously seen  Cavum:                 Appears normal         Ductal Arch:            Previously seen  Ventricles:            Appears normal         Diaphragm:              Appears normal  Choroid Plexus:        Appears normal         Stomach:                Appears normal, left  sided  Cerebellum:            Previously seen        Abdomen:                Previously seen  Posterior Fossa:       Previously seen        Abdominal Wall:          Previously seen  Nuchal Fold:           Previously seen        Cord Vessels:           Previously seen  Face:                  Orbits and profile     Kidneys:                Appear normal                         previously seen  Lips:                  Previously seen        Bladder:                Appears normal  Thoracic:              Appears normal         Spine:                  Previously seen  Heart:                 Appears normal         Upper Extremities:      Previously seen                         (4CH, axis, and                         situs)  RVOT:                  Appears normal         Lower Extremities:      Previously seen  LVOT:                  Previously seen  Other:  Female gender previously seen. VC, 3VV, 3VTV, Nasal Bone, Heels Left          5th digit visualized previously. Technically difficult due to advanced          GA and fetal position. ---------------------------------------------------------------------- Cervix Uterus Adnexa  Cervix  Not visualized (advanced GA >24wks)  Right Ovary  Not visualized. No adnexal mass visualized.  Left Ovary  Not visualized. No adnexal mass visualized.  Adnexa  No abnormality visualized. ---------------------------------------------------------------------- Impression  Follow up growth due to size>dates  Normal interval growth with measurements consistent with  large for gestational age 81 lb 14 oz.  Good fetal movement and amniotic fluid volume  Caution should be used regarding operative delivery or  significant divergance from the labor curve. Increased risk for  cesarean delivery was conveyed given the EFW. ---------------------------------------------------------------------- Recommendations  Follow up as clinically indicated.  Ms. Havrilla is scheduled for delivery in 1 week. ----------------------------------------------------------------------               Lin Landsman, MD Electronically Signed Final  Report    04/23/2021 05:07 pm ----------------------------------------------------------------------    Assessment and Plan:  Pregnancy: G2P0101 at [redacted]w[redacted]d  1. Supervision of other normal pregnancy, antepartum  2. Excessive fetal growth affecting management of pregnancy, antepartum, single or unspecified fetus - EFW:  4486 gm,   9 lb 14 oz,   > 99th %  - IOL recommended at 39 weeks for LGA fetus, per MFM   There are no diagnoses linked to this encounter. Term labor symptoms and general obstetric precautions including but not limited to vaginal bleeding, contractions, leaking of fluid and fetal movement were reviewed in detail with the patient. Please refer to After Visit Summary for other counseling recommendations.   Return in about 4 weeks (around 05/22/2021) for postpartum visit.   Brock Bad, MD  04/24/21

## 2021-04-25 ENCOUNTER — Other Ambulatory Visit: Payer: Self-pay | Admitting: Advanced Practice Midwife

## 2021-04-26 ENCOUNTER — Inpatient Hospital Stay (HOSPITAL_COMMUNITY): Payer: Medicaid Other

## 2021-04-26 ENCOUNTER — Other Ambulatory Visit: Payer: Self-pay | Admitting: Advanced Practice Midwife

## 2021-04-26 ENCOUNTER — Other Ambulatory Visit: Payer: Self-pay

## 2021-04-26 DIAGNOSIS — Z419 Encounter for procedure for purposes other than remedying health state, unspecified: Secondary | ICD-10-CM | POA: Diagnosis not present

## 2021-04-27 ENCOUNTER — Encounter (HOSPITAL_COMMUNITY): Payer: Self-pay | Admitting: Obstetrics and Gynecology

## 2021-04-27 ENCOUNTER — Inpatient Hospital Stay (HOSPITAL_COMMUNITY): Payer: Medicaid Other

## 2021-04-27 ENCOUNTER — Other Ambulatory Visit: Payer: Self-pay

## 2021-04-27 ENCOUNTER — Inpatient Hospital Stay (HOSPITAL_COMMUNITY)
Admission: AD | Admit: 2021-04-27 | Discharge: 2021-04-30 | DRG: 806 | Disposition: A | Discharge status: Home / Self Care | Payer: Medicaid Other | Attending: Obstetrics and Gynecology | Admitting: Obstetrics and Gynecology

## 2021-04-27 DIAGNOSIS — Z87891 Personal history of nicotine dependence: Secondary | ICD-10-CM | POA: Diagnosis not present

## 2021-04-27 DIAGNOSIS — Z20822 Contact with and (suspected) exposure to covid-19: Secondary | ICD-10-CM | POA: Diagnosis present

## 2021-04-27 DIAGNOSIS — D508 Other iron deficiency anemias: Secondary | ICD-10-CM

## 2021-04-27 DIAGNOSIS — O3660X0 Maternal care for excessive fetal growth, unspecified trimester, not applicable or unspecified: Secondary | ICD-10-CM | POA: Diagnosis present

## 2021-04-27 DIAGNOSIS — Z3A39 39 weeks gestation of pregnancy: Secondary | ICD-10-CM

## 2021-04-27 DIAGNOSIS — O9081 Anemia of the puerperium: Secondary | ICD-10-CM | POA: Diagnosis not present

## 2021-04-27 DIAGNOSIS — Z3480 Encounter for supervision of other normal pregnancy, unspecified trimester: Secondary | ICD-10-CM

## 2021-04-27 DIAGNOSIS — O09293 Supervision of pregnancy with other poor reproductive or obstetric history, third trimester: Secondary | ICD-10-CM

## 2021-04-27 DIAGNOSIS — D62 Acute posthemorrhagic anemia: Secondary | ICD-10-CM | POA: Diagnosis not present

## 2021-04-27 DIAGNOSIS — O3663X0 Maternal care for excessive fetal growth, third trimester, not applicable or unspecified: Secondary | ICD-10-CM | POA: Diagnosis not present

## 2021-04-27 LAB — CBC
HCT: 30.9 % — ABNORMAL LOW (ref 36.0–46.0)
Hemoglobin: 10.1 g/dL — ABNORMAL LOW (ref 12.0–15.0)
MCH: 29.1 pg (ref 26.0–34.0)
MCHC: 32.7 g/dL (ref 30.0–36.0)
MCV: 89 fL (ref 80.0–100.0)
Platelets: 380 10*3/uL (ref 150–400)
RBC: 3.47 MIL/uL — ABNORMAL LOW (ref 3.87–5.11)
RDW: 13.2 % (ref 11.5–15.5)
WBC: 10.4 10*3/uL (ref 4.0–10.5)
nRBC: 0 % (ref 0.0–0.2)

## 2021-04-27 LAB — RESP PANEL BY RT-PCR (FLU A&B, COVID) ARPGX2
Influenza A by PCR: NEGATIVE
Influenza B by PCR: NEGATIVE
SARS Coronavirus 2 by RT PCR: NEGATIVE

## 2021-04-27 MED ORDER — CALCIUM CARBONATE ANTACID 500 MG PO CHEW
1.0000 | CHEWABLE_TABLET | Freq: Three times a day (TID) | ORAL | Status: DC | PRN
  Administered 2021-04-27 – 2021-04-28 (×2): 200 mg via ORAL
  Filled 2021-04-27 (×2): qty 1

## 2021-04-27 MED ORDER — ONDANSETRON HCL 4 MG/2ML IJ SOLN
4.0000 mg | Freq: Four times a day (QID) | INTRAMUSCULAR | Status: DC | PRN
  Administered 2021-04-28: 4 mg via INTRAVENOUS
  Filled 2021-04-27: qty 2

## 2021-04-27 MED ORDER — TERBUTALINE SULFATE 1 MG/ML IJ SOLN: 0.2500 mg | Freq: Once | INTRAMUSCULAR | Status: DC | PRN

## 2021-04-27 MED ORDER — MISOPROSTOL 25 MCG QUARTER TABLET: 25.0000 ug | ORAL_TABLET | ORAL | Status: DC | PRN

## 2021-04-27 MED ORDER — ACETAMINOPHEN 325 MG PO TABS: 650.0000 mg | ORAL_TABLET | ORAL | Status: DC | PRN

## 2021-04-27 MED ORDER — SOD CITRATE-CITRIC ACID 500-334 MG/5ML PO SOLN
30.0000 mL | ORAL | Status: DC | PRN
Start: 2021-04-27 — End: 2021-04-28
  Administered 2021-04-28: 30 mL via ORAL
  Filled 2021-04-27: qty 30

## 2021-04-27 MED ORDER — OXYTOCIN BOLUS FROM INFUSION
333.0000 mL | Freq: Once | INTRAVENOUS | Status: AC
  Administered 2021-04-28: 333 mL via INTRAVENOUS

## 2021-04-27 MED ORDER — LACTATED RINGERS IV SOLN
500.0000 mL | INTRAVENOUS | Status: DC | PRN
Start: 2021-04-27 — End: 2021-04-28
  Administered 2021-04-28: 1000 mL via INTRAVENOUS

## 2021-04-27 MED ORDER — LACTATED RINGERS IV SOLN: INTRAVENOUS | Status: DC

## 2021-04-27 MED ORDER — OXYTOCIN-SODIUM CHLORIDE 30-0.9 UT/500ML-% IV SOLN
2.5000 [IU]/h | INTRAVENOUS | Status: DC
  Filled 2021-04-27: qty 500

## 2021-04-27 MED ORDER — OXYTOCIN-SODIUM CHLORIDE 30-0.9 UT/500ML-% IV SOLN
1.0000 m[IU]/min | INTRAVENOUS | Status: DC
Start: 2021-04-27 — End: 2021-04-28
  Administered 2021-04-27: 2 m[IU]/min via INTRAVENOUS

## 2021-04-27 MED ORDER — LIDOCAINE HCL (PF) 1 % IJ SOLN
30.0000 mL | INTRAMUSCULAR | Status: AC | PRN
  Administered 2021-04-28: 30 mL via SUBCUTANEOUS
  Filled 2021-04-27: qty 30

## 2021-04-27 MED ORDER — FENTANYL CITRATE (PF) 100 MCG/2ML IJ SOLN
100.0000 ug | INTRAMUSCULAR | Status: DC | PRN
  Administered 2021-04-28: 100 ug via INTRAVENOUS
  Filled 2021-04-27: qty 2

## 2021-04-27 MED ORDER — OXYCODONE-ACETAMINOPHEN 5-325 MG PO TABS: 2.0000 | ORAL_TABLET | ORAL | Status: DC | PRN

## 2021-04-27 MED ORDER — OXYCODONE-ACETAMINOPHEN 5-325 MG PO TABS: 1.0000 | ORAL_TABLET | ORAL | Status: DC | PRN

## 2021-04-27 NOTE — Progress Notes (Signed)
Labor Progress Note Charlotte Ward is a 20 y.o. G2P0101 at [redacted]w[redacted]d presented for IOL for LGA infant.   S: Called by RN that FB fell out. Patient doing good, still feeling contractions.   O:  BP 124/77   Pulse 96   Temp 98.2 F (36.8 C) (Oral)   Resp 19   Ht 5\' 7"  (1.702 m)   Wt 70.1 kg   LMP 07/27/2020   BMI 24.21 kg/m  EFM: 135/mod/15x15/none Contractions like every 2-3 min   CVE: Dilation: 4 Effacement (%): 60, 70 Station: -3 Presentation: Vertex Exam by:: Dr [email or web address]   A&P: 20 y.o. G2P0101 [redacted]w[redacted]d  #Labor: Progressing well, now s/p FB. Will monitor contraction pattern for the next 15-20 min as patient was contracting on her own well. If consistent, will cont monitoring, however anticipate starting pit 2x2 in the next 30 min.  #Pain: PRN  #FWB: Cat 1  #GBS negative   #LGA infant: >99th percentile. Aware, will prepare shoulder dystocia and atony. Monitor labor curve.  [redacted]w[redacted]d, DO 4:58 PM

## 2021-04-27 NOTE — Progress Notes (Signed)
Charlotte Ward is a 20 y.o. G2P0101 at [redacted]w[redacted]d  admitted for induction of labor due to LGA.  Subjective: Reports feels ok. Pain is about 5-6/10 with contractions. Otherwise coping well.   Objective: BP 132/76   Pulse 80   Temp 98.1 F (36.7 C) (Oral)   Resp 18   Ht 5\' 7"  (1.702 m)   Wt 70.1 kg   LMP 07/27/2020   BMI 24.21 kg/m  No intake/output data recorded. No intake/output data recorded.  FHT:  FHR: 135 bpm, variability: moderate,  accelerations:  Present,  decelerations:  Absent UC:   regular, every 1-3 minutes SVE:   Dilation: 4.5 Effacement (%): 50 Station: -2 Exam by:: Dr. [email or web address]  Labs: Lab Results  Component Value Date   WBC 10.4 04/27/2021   HGB 10.1 (L) 04/27/2021   HCT 30.9 (L) 04/27/2021   MCV 89.0 04/27/2021   PLT 380 04/27/2021    Assessment / Plan: Induction of labor due to LGA,  progressing well on pitocin  Labor: Progressed from 4cm--> 4.5 cm on Pitocin, head well applied, station -2, AROMed during this check. Answered patient's questions regarding LGA baby and anticipatory guidance regarding risk of shoulder dystocia. Patient expressed understanding would like to continue attempting vaginal delivery at this time.  Fetal Wellbeing:  Category I Pain Control:  Labor support without medications and open to IV pain meds I/D:   GBS negative Anticipated MOD:  NSVD  06/27/2021, MD, MPH OB Fellow, Faculty Practice

## 2021-04-27 NOTE — H&P (Signed)
OBSTETRIC ADMISSION HISTORY AND PHYSICAL  Charlotte Ward is a 20 y.o. female G38P0101 with IUP at [redacted]w[redacted]d by 20 wk [place]  presenting for IOL due to LGA infant. She reports +FMs, No LOF, no VB, no blurry vision, headaches or peripheral edema, and RUQ pain.  She plans on breast feeding. She request IUD outpatient for birth control. She received her prenatal care at  [facility]    Dating: By 20 wk [place] --->  Estimated Date of Delivery: 05/03/21  Sono:    @[redacted]w[redacted]d , CWD, normal anatomy, cephalic presentation, 4486g, EFW   Prenatal History/Complications:  --LGA infant as above --History of PPROM/pre-term delivery at [redacted]w[redacted]d  Past Medical History: Past Medical History:  Diagnosis Date   Anxiety    Supervision of normal first teen pregnancy in second trimester 09/26/2018   BabyRx 3/30--BP cuff given from office (left at desk for P/U) Nursing Staff Provider Office Location  [facility] Dating   LMP: 05/09/2018 Language   English Anatomy 05/11/2018  Wnl Flu Vaccine   09/26/2018 Genetic Screen  NIPS: low risk female   AFP:   TDaP vaccine   info given 11-21-18 Hgb A1C or  GTT Early  Third trimester : 2 hour wnl  Component     Latest Ref Rng & Units 11/21/2018 Glucose, Fasting     65 -    Past Surgical History: No past surgical history on file.  Obstetrical History: OB History     Gravida  2   Para  1   Term      Preterm  1   AB      Living  1      SAB      IAB      Ectopic      Multiple  0   Live Births  1           Social History Social History   Socioeconomic History   Marital status: Single    Spouse name: Not on file   Number of children: Not on file   Years of education: Not on file   Highest education level: High school graduate  Occupational History   Not on file  Tobacco Use   Smoking status: Former    Packs/day: 0.25    Types: Cigars, Cigarettes    Quit date: 10/08/2016    Years since quitting: 4.5   Smokeless tobacco: Never  Vaping Use   Vaping Use: Former   Devices: Not  since confirmed pregnancy  Substance and Sexual Activity   Alcohol use: No   Drug use: No   Sexual activity: Yes    Birth control/protection: None  Other Topics Concern   Not on file  Social History Narrative   Not on file   Social Determinants of Health   Financial Resource Strain: Not on file  Food Insecurity: Not on file  Transportation Needs: Not on file  Physical Activity: Not on file  Stress: Not on file  Social Connections: Not on file    Family History: Family History  Problem Relation Age of Onset   Asthma Mother    Migraines Mother     Allergies: Allergies  Allergen Reactions   Tomato Swelling    Patient reports only allergic to RAW tomatoes.    Medications Prior to Admission  Medication Sig Dispense Refill Last Dose   ferrous sulfate 325 (65 FE) MG tablet Take 1 tablet (325 mg total) by mouth every other day. 60 tablet 5    nystatin  cream (MYCOSTATIN) Apply to affected area 2 times daily (Patient not taking: No sig reported) 30 g 0    Prenatal Vit-Fe Fumarate-FA (PRENATAL VITAMIN) 27-0.8 MG TABS Take by mouth.      triamcinolone cream (KENALOG) 0.1 % APPLY TO AFFECTED AREA TWICE A DAY (Patient not taking: No sig reported)        Review of Systems   All systems reviewed and negative except as stated in HPI  Blood pressure 112/70, pulse 85, temperature 97.6 F (36.4 C), temperature source Oral, resp. rate 16, height 5\' 7"  (1.702 m), weight 70.1 kg, last menstrual period 07/27/2020, currently breastfeeding. General appearance: alert, cooperative, and no distress Lungs: Normal WOB Heart: regular rate and rhythm Abdomen: soft, non-tender Extremities: Homans sign is negative, no sign of DVT Presentation: cephalic confirmed with cervical check and bedside 09/24/2020 for maternal reassurance  Fetal monitoringBaseline: 140 bpm, Variability: Good {> 6 bpm), Accelerations: Reactive, and Decelerations: Absent Uterine activityFrequency: Every 2 minutes    Prenatal  labs: ABO, Rh: --/--/PENDING (10/02 1233) Antibody: PENDING (10/02 1233) Rubella: 14.50 (03/23 1531) RPR: Non Reactive (07/20 1148)  HBsAg: Negative (03/23 1531)  HIV: Non Reactive (07/20 1148)  GBS: Negative/-- (09/15 1110)  2 hr Glucola negative Genetic screening  normal  Anatomy [birth year]2005 normal female   Prenatal Transfer Tool  Maternal Diabetes: No Genetic Screening: Normal Maternal Ultrasounds/Referrals: Normal Fetal Ultrasounds or other Referrals:  None Maternal Substance Abuse:  No Significant Maternal Medications:  None Significant Maternal Lab Results: Group B Strep negative  Results for orders placed or performed during the hospital encounter of 04/27/21 (from the past 24 hour(s))  Type and screen   Collection Time: 04/27/21 12:33 PM  Result Value Ref Range   ABO/RH(D) PENDING    Antibody Screen PENDING    Sample Expiration      04/30/2021,2359 Performed at [facility], [address], [place], [place]     Patient Active Problem List   Diagnosis Date Noted   Large for gestational age fetus affecting management of mother 04/27/2021   Supervision of other normal pregnancy, antepartum 10/10/2020   History of PROM (premature rupture of membranes), currently pregnant 01/21/2019    Assessment/Plan:  Charlotte Ward is a 20 y.o. G2P0101 at [redacted]w[redacted]d here for IOL due to LGA per MFM.   #Labor: After verbal consent, placed a FB. Patient tolerated well. Currently contracting about every 2 min, will cont with FB only for now. If spaces, will likely give a cytotec.  #Pain: PRN  #FWB: Cat 1 #ID: GBS Neg  #MOF: Breastfeeding  #MOC: IUD outpatient  #Circ: Not interested   #LGA infant: >99th percentile, 4486 g at [redacted]w[redacted]d. Pelvis proven to 3000g. Mom is aware of this and has been counseled on vaginal delivery vs C-section and possibility of a c-section if laboring not progressing. Will proceed with vaginal IOL and prepare for a shoulder dystocia.   [redacted]w[redacted]d, DO   04/27/2021, 12:52 PM

## 2021-04-28 ENCOUNTER — Encounter (HOSPITAL_COMMUNITY): Payer: Self-pay | Admitting: Anesthesiology

## 2021-04-28 ENCOUNTER — Encounter (HOSPITAL_COMMUNITY): Payer: Self-pay | Admitting: Obstetrics and Gynecology

## 2021-04-28 DIAGNOSIS — O3663X0 Maternal care for excessive fetal growth, third trimester, not applicable or unspecified: Secondary | ICD-10-CM | POA: Diagnosis not present

## 2021-04-28 DIAGNOSIS — Z3A39 39 weeks gestation of pregnancy: Secondary | ICD-10-CM | POA: Diagnosis not present

## 2021-04-28 LAB — CBC
HCT: 26.4 % — ABNORMAL LOW (ref 36.0–46.0)
Hemoglobin: 8.3 g/dL — ABNORMAL LOW (ref 12.0–15.0)
MCH: 29 pg (ref 26.0–34.0)
MCHC: 31.4 g/dL (ref 30.0–36.0)
MCV: 92.3 fL (ref 80.0–100.0)
Platelets: 338 10*3/uL (ref 150–400)
RBC: 2.86 MIL/uL — ABNORMAL LOW (ref 3.87–5.11)
RDW: 13.2 % (ref 11.5–15.5)
WBC: 22.5 10*3/uL — ABNORMAL HIGH (ref 4.0–10.5)
nRBC: 0 % (ref 0.0–0.2)

## 2021-04-28 LAB — DIC (DISSEMINATED INTRAVASCULAR COAGULATION)PANEL
D-Dimer, Quant: >20 ug/mL-FEU — ABNORMAL HIGH (ref 0.00–0.50)
Fibrinogen: 254 mg/dL (ref 210–475)
INR: 1.2 (ref 0.8–1.2)
Platelets: 345 10*3/uL (ref 150–400)
Prothrombin Time: 14.8 seconds (ref 11.4–15.2)
Smear Review: NONE SEEN
aPTT: 27 seconds (ref 24–36)

## 2021-04-28 LAB — RPR: RPR Ser Ql: NONREACTIVE

## 2021-04-28 MED ORDER — ONDANSETRON HCL 4 MG PO TABS: 4.0000 mg | ORAL_TABLET | ORAL | Status: DC | PRN

## 2021-04-28 MED ORDER — MEDROXYPROGESTERONE ACETATE 150 MG/ML IM SUSP: 150.0000 mg | INTRAMUSCULAR | Status: DC | PRN

## 2021-04-28 MED ORDER — METHYLERGONOVINE MALEATE 0.2 MG/ML IJ SOLN
INTRAMUSCULAR | Status: AC
  Filled 2021-04-28: qty 3

## 2021-04-28 MED ORDER — FENTANYL CITRATE (PF) 100 MCG/2ML IJ SOLN
INTRAMUSCULAR | Status: AC
  Filled 2021-04-28: qty 4

## 2021-04-28 MED ORDER — BENZOCAINE-MENTHOL 20-0.5 % EX AERO: 1.0000 "application " | INHALATION_SPRAY | CUTANEOUS | Status: DC | PRN

## 2021-04-28 MED ORDER — SENNOSIDES-DOCUSATE SODIUM 8.6-50 MG PO TABS: 2.0000 | ORAL_TABLET | Freq: Every day | ORAL | Status: DC

## 2021-04-28 MED ORDER — IBUPROFEN 600 MG PO TABS
600.0000 mg | ORAL_TABLET | Freq: Four times a day (QID) | ORAL | Status: DC
  Administered 2021-04-28 – 2021-04-30 (×7): 600 mg via ORAL
  Filled 2021-04-28 (×6): qty 1

## 2021-04-28 MED ORDER — CEFAZOLIN SODIUM-DEXTROSE 1-4 GM/50ML-% IV SOLN
1.0000 g | Freq: Three times a day (TID) | INTRAVENOUS | Status: AC
  Administered 2021-04-28 (×3): 1 g via INTRAVENOUS
  Filled 2021-04-28 (×4): qty 50

## 2021-04-28 MED ORDER — PRENATAL MULTIVITAMIN CH
1.0000 | ORAL_TABLET | Freq: Every day | ORAL | Status: DC
  Administered 2021-04-28 – 2021-04-29 (×2): 1 via ORAL
  Filled 2021-04-28 (×2): qty 1

## 2021-04-28 MED ORDER — IBUPROFEN 600 MG PO TABS
600.0000 mg | ORAL_TABLET | Freq: Four times a day (QID) | ORAL | Status: DC
  Administered 2021-04-28: 600 mg via ORAL
  Filled 2021-04-28: qty 1

## 2021-04-28 MED ORDER — METHYLERGONOVINE MALEATE 0.2 MG/ML IJ SOLN: 0.2000 mg | INTRAMUSCULAR | Status: DC | PRN

## 2021-04-28 MED ORDER — METHYLERGONOVINE MALEATE 0.2 MG PO TABS
0.2000 mg | ORAL_TABLET | ORAL | Status: DC | PRN
  Administered 2021-04-28: 0.2 mg via ORAL
  Filled 2021-04-28: qty 1

## 2021-04-28 MED ORDER — WITCH HAZEL-GLYCERIN EX PADS: 1.0000 "application " | MEDICATED_PAD | CUTANEOUS | Status: DC | PRN

## 2021-04-28 MED ORDER — OXYTOCIN-SODIUM CHLORIDE 30-0.9 UT/500ML-% IV SOLN
INTRAVENOUS | Status: AC
  Administered 2021-04-28: 500 mL via INTRAVENOUS
  Filled 2021-04-28: qty 500

## 2021-04-28 MED ORDER — MEASLES, MUMPS & RUBELLA VAC IJ SOLR: 0.5000 mL | Freq: Once | INTRAMUSCULAR | Status: DC

## 2021-04-28 MED ORDER — MISOPROSTOL 200 MCG PO TABS
ORAL_TABLET | ORAL | Status: AC
  Administered 2021-04-28: 1000 ug
  Filled 2021-04-28: qty 5

## 2021-04-28 MED ORDER — TETANUS-DIPHTH-ACELL PERTUSSIS 5-2.5-18.5 LF-MCG/0.5 IM SUSY: 0.5000 mL | PREFILLED_SYRINGE | Freq: Once | INTRAMUSCULAR | Status: DC

## 2021-04-28 MED ORDER — SIMETHICONE 80 MG PO CHEW: 80.0000 mg | CHEWABLE_TABLET | ORAL | Status: DC | PRN

## 2021-04-28 MED ORDER — CARBOPROST TROMETHAMINE 250 MCG/ML IM SOLN
INTRAMUSCULAR | Status: AC
  Filled 2021-04-28: qty 1

## 2021-04-28 MED ORDER — COCONUT OIL OIL: 1.0000 "application " | TOPICAL_OIL | Status: DC | PRN

## 2021-04-28 MED ORDER — ACETAMINOPHEN 325 MG PO TABS: 650.0000 mg | ORAL_TABLET | ORAL | Status: DC | PRN

## 2021-04-28 MED ORDER — TRANEXAMIC ACID-NACL 1000-0.7 MG/100ML-% IV SOLN
INTRAVENOUS | Status: AC
  Administered 2021-04-28: 1000 mg
  Filled 2021-04-28: qty 100

## 2021-04-28 MED ORDER — DIBUCAINE (PERIANAL) 1 % EX OINT: 1.0000 "application " | TOPICAL_OINTMENT | CUTANEOUS | Status: DC | PRN

## 2021-04-28 MED ORDER — DIPHENHYDRAMINE HCL 25 MG PO CAPS: 25.0000 mg | ORAL_CAPSULE | Freq: Four times a day (QID) | ORAL | Status: DC | PRN

## 2021-04-28 MED ORDER — ONDANSETRON HCL 4 MG/2ML IJ SOLN: 4.0000 mg | INTRAMUSCULAR | Status: DC | PRN

## 2021-04-28 NOTE — Progress Notes (Signed)
Pt transferred from LD to postpartum rm 513 @0700 , report given to RN and Risa Grill, RN resumed care at shift change. Fundus on HO by R. Wayna Chalet, RN was firm/ml/U w/ scant to light bleeding with a golfball size clot expelled. Pt given PO Methergine at 0704. M. Tressia Labrum returned to assess pt at 0730 and pt had soaked through yellow pad and had a trickle bleed. Fundus was F/ML/U with light bleeding. Dr. Recardo Evangelist called to assess pt bleeding BS. Dr. Donavan Foil arrived at 0740 BS. Fundus rub expelled large size clots and pt began to moderately bleed. 1,000 mg rectal cytotec, Donavan Foil bolus Oxytocin, and TXA given at 0745. JADA placed by Dr. @0800 . Suction DC @0841  w/ output of 130 ml blood. JADA remained in place and was pulled at 1045 by Dr. Donavan Foil. Pt fundus F/ML/-1 w/ scant bleeding. Pt reports pain 3/10, vitals WNL. Will cont to monitor.

## 2021-04-28 NOTE — Lactation Note (Signed)
This note was copied from a baby's chart. Lactation Consultation Note  Patient Name: Charlotte Ward [id number] Date: 04/28/2021 Reason for consult: Initial assessment;Term Age:20 hours  Mom unable to feed infant at this time due to recent [facility] on postpartum floor. Infant noted to be cueing in bassinet. Mom/Dad consented for [facility]. When DBM was brought to room, infant was sleeping in Dad's arms. Dad to call out when infant cues again.  [name] [facility] 04/28/2021, 8:54 AM

## 2021-04-28 NOTE — Lactation Note (Signed)
This note was copied from a baby's chart. Lactation Consultation Note  Patient Name: Charlotte Ward [id number] Date: 04/28/2021 Reason for consult: Initial assessment;Term Age:21 hours  LC visit attempted. Parents were sleeping; infant was in bassinet, and also appeared asleep.  [name] [facility] 04/28/2021, 12:48 PM

## 2021-04-28 NOTE — Lactation Note (Signed)
This note was copied from a baby's chart. Lactation Consultation Note  Patient Name: Charlotte Ward [id number] Date: 04/28/2021 Reason for consult: Follow-up assessment;Mother's request;Difficult latch;Term Age: 21 hrs  LC did suck training to get infant to bring his tongue down. Infant latched in cross cradle with signs of milk transfer.  Mom blood loss, LC set her up on DEBP to post pump after latching q 3 hrs for .  Mom able to collect 11 ml of EBM.  Mom aware EBM good 4 hrs room temp.   Plan 1. To feed based on cues 8-12x 24 hr period. Mom to offer breasts and look for signs of milk transfer.  2. If unable to latch, Mom to give EBM via spoon.  3. Mom to supplement with EBM first followed by [facility] with pace bottle feeding and yellow slow flow nipple.  4. Mom to use DEBP as stated above. Mom aware flange sizes can change and call for assistance if needed.  All questions answered at the end of the visit.  Maternal Data Has patient been taught Hand Expression?: Yes Does the patient have breastfeeding experience prior to this delivery?: Yes How long did the patient breastfeed?: 1 year pump and bottle feeding.Mom states unable to get infant to latch.  Feeding Mother's Current Feeding Choice: Breast Milk and Donor Milk  LATCH Score Latch: Repeated attempts needed to sustain latch, nipple held in mouth throughout feeding, stimulation needed to elicit sucking reflex.  Audible Swallowing: Spontaneous and intermittent  Type of Nipple: Everted at rest and after stimulation  Comfort (Breast/Nipple): Soft / non-tender  Hold (Positioning): Assistance needed to correctly position infant at breast and maintain latch.  LATCH Score: 8   Lactation Tools Discussed/Used Tools: Pump;Flanges Flange Size: 24 Breast pump type: Double-Electric Breast Pump Pump Education: Milk Storage Reason for Pumping: increase stimulation Pumping frequency: every 3 hrs for 15  min  Interventions Interventions: Breast feeding basics reviewed;Assisted with latch;Skin to skin;Breast massage;Hand express;Breast compression;Adjust position;Support pillows;Position options;Expressed milk;DEBP;Education;Pace feeding;LC Psychologist, educational;Infant Driven Feeding Algorithm education  Discharge Pump: Personal WIC Program: No  Consult Status Consult Status: Follow-up Date: 04/29/21 Follow-up type: In-patient    Logen Fowle  [facility] 04/28/2021, 2:31 PM

## 2021-04-28 NOTE — Progress Notes (Addendum)
Called to bedside for Patient passed multiple clots after getting to post partum. Patient had passed one big clot prior to leaving post partum and was initially attempted to give IM Methergine. At that time declined and received PO Methergine.   Patient reports feeling cramping  When assessed at bedside, fundus 2 cm above umbilicus to maternal right. With fundal massage 3-4 large melon sized clots passed  - JADA placed (inflated with 120cc) - Cytotec 1000mg  rectally placed - Pitocin IV - CBC, PTT, and Fibrinogen sent - Plan to deflate JADA at 0900 AM and take off suction, leave in place for 30 mins to assess if ok to remove or needs to be re-inflated  Dr. at bedside for JADA placement and [facility] management.  [facility], MD, MPH OB Fellow, Faculty Practice

## 2021-04-28 NOTE — Discharge Summary (Addendum)
Postpartum Discharge Summary  Date of Service updated 04/30/2021     Patient Name: Charlotte Ward DOB: 2001/06/06 MRN: [id number]  Date of admission: 04/27/2021 Delivery date:04/28/2021  Delivering provider: Renard Matter  Date of discharge: 04/30/2021  Admitting diagnosis: Large for gestational age fetus affecting management of mother [O36.60X0] Intrauterine pregnancy: [redacted]w[redacted]d     Secondary diagnosis:  Active Problems:   Supervision of other normal pregnancy, antepartum   Large for gestational age fetus affecting management of mother   Vaginal delivery   Postpartum hemorrhage  Additional problems: acute blood loss anemia secondary to delayed [place]    Discharge diagnosis: Term Pregnancy Delivered                                              Post partum procedures: JADA , transfused 2U pRBCs Augmentation: AROM, Pitocin, and IP Foley Complications: None  Hospital course: Induction of Labor With Vaginal Delivery   20 y.o. yo G2P0101 at [redacted]w[redacted]d was admitted to the hospital 04/27/2021 for induction of labor.  Indication for induction: Elective for LGA  Patient had an uncomplicated labor course as follows: Membrane Rupture Time/Date: 11:03 PM ,04/27/2021   Delivery Method:Vaginal, Spontaneous  Episiotomy: Median  Lacerations:  None  Details of delivery can be found in separate delivery note.  Postpartum course was notable for delayed postpartum hemorrhage of ~2L requiring cytotec and JADA. Hgb decreased from 8.3 to 5.6 requiring transfusion of 2U pRBCs. She had appropriate increase in Hgb post transfusion to 8.6 with improvement in her bleeding. She was discharged on PO iron every other day. On day of discharge patient is feeling well with no dizziness, weakness, shortness of breath or chest pain. Patient is discharged home 04/30/21.  Newborn Data: Birth date:04/28/2021  Birth time:4:43 AM  Gender:Female  Living status:Living  Apgars:6 ,9  Weight:4210 g   Magnesium Sulfate received: No BMZ  received: No Rhophylac:N/A MMR:N/A T-DaP:Given prenatally Flu: No Transfusion:Yes  Physical exam  Vitals:   04/29/21 1430 04/29/21 1450 04/29/21 2150 04/30/21 0600  BP: 104/69 98/65 (!) 104/49 112/64  Pulse: 72 67 68 65  Resp: $Remo'16  16 18  'WdjNG$ Temp: 97.8 F (36.6 C) 97.9 F (36.6 C) 98.2 F (36.8 C) 98.1 F (36.7 C)  TempSrc:  Axillary Axillary Oral  SpO2: 100% 100%  100%  Weight:      Height:       General: alert, cooperative, and no distress Lochia: appropriate Uterine Fundus: firm Incision: N/A DVT Evaluation: No evidence of DVT seen on physical exam. No significant calf/ankle edema. Labs: Lab Results  Component Value Date   WBC 13.9 (H) 04/29/2021   HGB 8.6 (L) 04/29/2021   HCT 25.9 (L) 04/29/2021   MCV 88.4 04/29/2021   PLT 266 04/29/2021   CMP Latest Ref Rng & Units 08/11/2018  Glucose 70 - 99 mg/dL 98  BUN 6 - 20 mg/dL 9  Creatinine 0.44 - 1.00 mg/dL 0.66  Sodium 135 - 145 mmol/L 136  Potassium 3.5 - 5.1 mmol/L 4.0  Chloride 98 - 111 mmol/L 107  CO2 22 - 32 mmol/L 19(L)  Calcium 8.9 - 10.3 mg/dL 8.9   Edinburgh Score: Edinburgh Postnatal Depression Scale Screening Tool 03/01/2019  I have been able to laugh and see the funny side of things. 0  I have looked forward with enjoyment to things. 0  I have blamed  myself unnecessarily when things went wrong. 0  I have been anxious or worried for no good reason. 0  I have felt scared or panicky for no good reason. 0  Things have been getting on top of me. 0  I have been so unhappy that I have had difficulty sleeping. 0  I have felt sad or miserable. 0  I have been so unhappy that I have been crying. 0  The thought of harming myself has occurred to me. 0  Edinburgh Postnatal Depression Scale Total 0     After visit meds:  Allergies as of 04/30/2021       Reactions   Tomato Swelling   Patient reports only allergic to RAW tomatoes.        Medication List     STOP taking these medications    nystatin  cream Commonly known as: MYCOSTATIN   triamcinolone cream 0.1 % Commonly known as: KENALOG       TAKE these medications    acetaminophen 325 MG tablet Commonly known as: Tylenol Take 2 tablets (650 mg total) by mouth every 4 (four) hours as needed for mild pain or moderate pain.   ferrous sulfate 325 (65 FE) MG tablet Take 1 tablet (325 mg total) by mouth every other day.   ibuprofen 200 MG tablet Commonly known as: ADVIL Take 3 tablets (600 mg total) by mouth every 6 (six) hours as needed for mild pain or moderate pain.   Prenatal Vitamin 27-0.8 MG Tabs Take by mouth.         Discharge home in stable condition Acute blood loss anemia: Delayed PPH of 2L, s/p JADA, cytotec, and 2U pRBCs. Bleeding improved. Continue PO iron. Infant Feeding: Breast Infant Disposition:home with mother Discharge instruction: per After Visit Summary and Postpartum booklet. Activity: Advance as tolerated. Pelvic rest for 6 weeks.  Diet: routine diet Future Appointments: Future Appointments  Date Time Provider [place]  06/05/2021  1:10 PM Gavin Pound, CNM [facility] None   Follow up Visit: Message sent to [facility] by Dr. Cy Blamer on 10/3  Please schedule this patient for a In person postpartum visit in 4 weeks with the following provider: Any provider. Additional Postpartum F/U: None   Low risk pregnancy complicated by:  None Delivery mode:  Vaginal, Spontaneous  Anticipated Birth Control:   Plans outpatient IUD  Lattie Corns PGY-1, Faculty Service  GME ATTESTATION:  I saw and evaluated the patient. I agree with the findings and the plan of care as documented in the resident's note and have made any necessary edits.   Renard Matter, MD, MPH OB Fellow, [place] for [place] 04/30/2021 8:55 AM'

## 2021-04-28 NOTE — Progress Notes (Signed)
Notified Dr. Ephriam Jenkins of pt passing clot and soiled brief; totaling . Verbal order received and given for PO Methergerine 0.2mg . Pt is asymptomatic at this time; measuring firm 2 below.

## 2021-04-28 NOTE — Progress Notes (Signed)
Went to evaluate patient. No significant bleeding around JADA. Fundal rub without additional bleeding. JADA pulled, no bleeding with fundal rub, fundus very firm and just below umbilicus, midline.   Labs reviewed, expected hgb drop to 8, DIC panel not in process for some reason, add on lab sent.   10:59 AM 04/28/21 Venora Maples, MD/MPH Attending Family Medicine Physician, [facility] for [facility], [facility]

## 2021-04-29 LAB — CBC
HCT: 18.4 % — ABNORMAL LOW (ref 36.0–46.0)
HCT: 25.9 % — ABNORMAL LOW (ref 36.0–46.0)
Hemoglobin: 5.8 g/dL — CL (ref 12.0–15.0)
Hemoglobin: 8.6 g/dL — ABNORMAL LOW (ref 12.0–15.0)
MCH: 28.9 pg (ref 26.0–34.0)
MCH: 29.4 pg (ref 26.0–34.0)
MCHC: 31.5 g/dL (ref 30.0–36.0)
MCHC: 33.2 g/dL (ref 30.0–36.0)
MCV: 91.5 fL (ref 80.0–100.0)
MCV: 88.4 fL (ref 80.0–100.0)
Platelets: 263 10*3/uL (ref 150–400)
Platelets: 266 10*3/uL (ref 150–400)
RBC: 2.01 MIL/uL — ABNORMAL LOW (ref 3.87–5.11)
RBC: 2.93 MIL/uL — ABNORMAL LOW (ref 3.87–5.11)
RDW: 13.4 % (ref 11.5–15.5)
RDW: 14.4 % (ref 11.5–15.5)
WBC: 15.4 10*3/uL — ABNORMAL HIGH (ref 4.0–10.5)
WBC: 13.9 10*3/uL — ABNORMAL HIGH (ref 4.0–10.5)
nRBC: 0 % (ref 0.0–0.2)
nRBC: 0 % (ref 0.0–0.2)

## 2021-04-29 LAB — PREPARE RBC (CROSSMATCH)

## 2021-04-29 MED ORDER — SODIUM CHLORIDE 0.9% IV SOLUTION: Freq: Once | INTRAVENOUS | Status: AC

## 2021-04-29 MED ORDER — FERROUS SULFATE 325 (65 FE) MG PO TABS
325.0000 mg | ORAL_TABLET | ORAL | Status: DC
  Administered 2021-04-29: 325 mg via ORAL
  Filled 2021-04-29: qty 1

## 2021-04-29 NOTE — Progress Notes (Addendum)
   04/29/21 0930 04/29/21 0944 04/29/21 0951  Vitals  Vital Signs Type (Include Temp, Pulse, RR, and B/P) 15 min. post blood start 15 min. post blood start  --   Temp 97.8 F (36.6 C) 98.1 F (36.7 C) 98 F (36.7 C)  Pulse Rate 75 66 66  Resp 17 17 17   BP 108/73 91/60 (!) 87/64 (md notified: md to bedside)  Oxygen Therapy  SpO2 100 % 100 %  --   Transfuse RBC  Volume  --   --  38  Pt denied any s/s of reaction initially. When RN called MD, Pt stated her head felt heavy.  Pt;" I think I am kind of sleepy so everything feels weird". Shaw CNM to bedside: restart blood

## 2021-04-29 NOTE — Progress Notes (Addendum)
   04/29/21 0905  Transfuse RBC  Volume 36  IV Infiltrated, MD notified, Restart line on other side New IV started, vein blew. RN called for another RN to start IV

## 2021-04-29 NOTE — Progress Notes (Addendum)
Patient ID: Charlotte Ward, female   DOB: 06/20/2001, 20 y.o.   MRN: [id number]  POSTPARTUM PROGRESS NOTE  Post Partum Day 1  Subjective:  Charlotte Ward is a 20 y.o. B2W4132 s/p SVD at [redacted]w[redacted]d.  No acute events overnight.  Pt denies problems with ambulating, voiding or po intake.  She denies nausea or vomiting.  Pain is well controlled.  She has had flatus. She has had bowel movement.  Lochia Small.   Reports feels some dizziness when ambulating. Otherwise asymptomatic.   Objective: Blood pressure 108/72, pulse 83, temperature 98.4 F (36.9 C), temperature source Oral, resp. rate 18, height 5\' 7"  (1.702 m), weight 70.1 kg, last menstrual period 07/27/2020, SpO2 100 %, unknown if currently breastfeeding.  Physical Exam:  General: alert, cooperative and no distress Chest: no respiratory distress Heart: regular rate, distal pulses intact Abdomen: soft, nontender,  Uterine Fundus: firm, appropriately tender DVT Evaluation: No calf swelling or tenderness Extremities: No edema Skin: warm, dry  Recent Labs    04/28/21 0803 04/29/21 0541  HGB 8.3* 5.8*  HCT 26.4* 18.4*    Assessment/Plan: Palma Buster is a 20 y.o. 36 s/p SVD at [redacted]w[redacted]d 36 s/p SVD at [redacted]w[redacted]d   PPD#1 - Doing well. Some symptomatic anemia Contraception: Plans outpatient IUD Feeding: Breast   #Acute blood loss anemia Patient with delayed hemorrhage of ~2L on 9/3 s/p JADA and Cytotec. Bleeding now improved.  HgB 10.1>8.3>5.8.  - 2 units pRBCs ordered and follow up post transfusion CBC  Dispo: plan for dispo tomorrow on PPD#2   LOS: 2 days   [place], 11/3, Student-PA  04/29/2021, 7:38 AM    GME ATTESTATION:  I saw and evaluated the patient. I agree with the findings and the plan of care as documented in the student's note and made necessary changes.  06/29/2021, MD, MPH OB Fellow, Faculty Practice [facility], Center for [facility] 04/29/2021 9:05 AM

## 2021-04-29 NOTE — Lactation Note (Signed)
This note was copied from a baby's chart. Lactation Consultation Note  Patient Name: Charlotte Ward [id number] Date: 04/29/2021 Reason for consult: Follow-up assessment;Mother's request;Difficult latch;Term;Other (Comment) [facility]) Age:21 hours Mom states infant urine and stool output diminished since day before. Infant only had 1 urine since 9 am and no stool. Mom putting infant to breast and supplementing with EBM 6-7 ml per feeding. Mom discontinued use of DBM.   LC talked with RN, Everardo All, given mothers PPH might affect her milk supply. Mom should continue to supplement with [facility] along with her EBM after latching.   BF supplementation guide provided following latching, Mom aware supplement total of 15 ml or more.  Mom to pump with DEBP q 3hrs for .  RN, Huntley Dec Riffey to put in order for more DBM and encourage Mom to increase supplementation volume.  Maternal Data    Feeding Mother's Current Feeding Choice: Breast Milk and Donor Milk  LATCH Score Latch: Repeated attempts needed to sustain latch, nipple held in mouth throughout feeding, stimulation needed to elicit sucking reflex.  Audible Swallowing: Spontaneous and intermittent  Type of Nipple: Everted at rest and after stimulation  Comfort (Breast/Nipple): Soft / non-tender  Hold (Positioning): Assistance needed to correctly position infant at breast and maintain latch.  LATCH Score: 8   Lactation Tools Discussed/Used Tools: Pump;Flanges Breast pump type: Double-Electric Breast Pump Pump Education: Setup, frequency, and cleaning;Milk Storage Reason for Pumping: increase stimulation Pumping frequency: every 3 hrs for 15 min  Interventions Interventions: Breast feeding basics reviewed;Education;Pace feeding;Infant Driven Feeding Algorithm education;DEBP  Discharge    Consult Status Consult Status: Follow-up Date: 04/30/21 Follow-up type: In-patient    Troyce Febo  Nicholson-Springer 04/29/2021, 5:04 PM

## 2021-04-30 ENCOUNTER — Encounter: Payer: Self-pay | Admitting: Family Medicine

## 2021-04-30 LAB — TYPE AND SCREEN
ABO/RH(D): O POS
Antibody Screen: NEGATIVE
Unit division: 0
Unit division: 0

## 2021-04-30 LAB — BPAM RBC
Blood Product Expiration Date: 202210282359
Blood Product Expiration Date: 202210302359
ISSUE DATE / TIME: 202210040833
ISSUE DATE / TIME: 202210041304
Unit Type and Rh: 5100
Unit Type and Rh: 5100

## 2021-04-30 MED ORDER — FERROUS SULFATE 325 (65 FE) MG PO TABS: 325.0000 mg | ORAL_TABLET | ORAL | 0 refills | Status: AC

## 2021-04-30 MED ORDER — IBUPROFEN 200 MG PO TABS: 600.0000 mg | ORAL_TABLET | Freq: Four times a day (QID) | ORAL | Status: AC | PRN

## 2021-04-30 MED ORDER — ACETAMINOPHEN 325 MG PO TABS: 650.0000 mg | ORAL_TABLET | ORAL | Status: AC | PRN

## 2021-04-30 NOTE — Lactation Note (Signed)
This note was copied from a baby's chart. Lactation Consultation Note  Patient Name: Charlotte Ward [id number] Date: 04/30/2021 Reason for consult: Follow-up assessment Age:21 hours   P2 mother whose infant is now 68 hours old.  This is a term baby at 39+2 weeks.  Mother pumped and bottle fed her first child (now 76 years old) for one year.  Mother was pumping when I arrived; obtaining large volume of milk.  She already had 30 mls collected at bedside.  Mother reported that baby fell asleep breast feeding and she wanted to pump afterwards.  Praised mother for her efforts.  Baby began awakening and I assisted to latch to the left breast in the football hold.  Observed him feeding for 5 minutes prior to exiting.  Mother will continue to pump as desired after discharge.  Changed flange size from a #21 to a #24 flange size for better fit and comfort.  Mother has a DEBP for home use and our OP phone number for any further concerns.  Support person asleep on the couch.   Maternal Data    Feeding    LATCH Score Latch: Grasps breast easily, tongue down, lips flanged, rhythmical sucking.  Audible Swallowing: Spontaneous and intermittent  Type of Nipple: Everted at rest and after stimulation  Comfort (Breast/Nipple): Soft / non-tender  Hold (Positioning): Assistance needed to correctly position infant at breast and maintain latch.  LATCH Score: 9   Lactation Tools Discussed/Used Tools: Pump;Flanges Flange Size: 24 (Moved mother to #24 flanges) Breast pump type: Double-Electric Breast Pump;Manual  Interventions Interventions: Breast feeding basics reviewed;Assisted with latch;Skin to skin;Breast massage;Hand express;Breast compression;Expressed milk;Position options;Support pillows;Adjust position;DEBP;Education  Discharge Discharge Education: Engorgement and breast care Pump: DEBP;Manual;Personal  Consult Status Consult Status: Complete Date: 04/30/21 Follow-up type: Call as  needed    Estefan Pattison R Dyshawn Cangelosi 04/30/2021, 10:24 AM

## 2021-05-08 ENCOUNTER — Telehealth (HOSPITAL_COMMUNITY): Payer: Self-pay

## 2021-05-08 NOTE — Telephone Encounter (Signed)
No answer. Left message to return nurse call.  Marcelino Duster [facility] 05/08/2021,1416

## 2021-05-13 ENCOUNTER — Telehealth: Payer: Self-pay

## 2021-05-13 NOTE — Telephone Encounter (Signed)
Called pt to get dates for FMLA leave to complete paperwork, no answer, left VM

## 2021-05-22 ENCOUNTER — Ambulatory Visit (INDEPENDENT_AMBULATORY_CARE_PROVIDER_SITE_OTHER): Payer: Medicaid Other | Admitting: Obstetrics and Gynecology

## 2021-05-22 ENCOUNTER — Other Ambulatory Visit: Payer: Self-pay

## 2021-05-22 ENCOUNTER — Encounter: Payer: Self-pay | Admitting: Obstetrics and Gynecology

## 2021-05-22 VITALS — BP 91/57 | HR 73 | Wt 125.8 lb

## 2021-05-22 DIAGNOSIS — O9122 Nonpurulent mastitis associated with the puerperium: Secondary | ICD-10-CM | POA: Diagnosis not present

## 2021-05-22 DIAGNOSIS — Z3009 Encounter for other general counseling and advice on contraception: Secondary | ICD-10-CM | POA: Diagnosis not present

## 2021-05-22 DIAGNOSIS — Z30430 Encounter for insertion of intrauterine contraceptive device: Secondary | ICD-10-CM

## 2021-05-22 MED ORDER — DICLOXACILLIN SODIUM 500 MG PO CAPS: 500.0000 mg | ORAL_CAPSULE | Freq: Four times a day (QID) | ORAL | 0 refills | Status: DC

## 2021-05-22 NOTE — Progress Notes (Signed)
Post Partum Visit Note  Charlotte Ward is a 20 y.o. G29P1102 female who presents for a postpartum visit. She is 4 weeks postpartum following a normal spontaneous vaginal delivery.  I have fully reviewed the prenatal and intrapartum course. The delivery was at [redacted]w[redacted]d gestational weeks.  Anesthesia: none. Postpartum pt c/o L breast pain and fever sx's x 1 week. Baby is doing well. Baby is feeding by breast. Bleeding no bleeding. Bowel function is normal. Bladder function is normal. Patient is not sexually active. Contraception method is none. Postpartum depression screening: negative.   The pregnancy intention screening data noted above was reviewed. Potential methods of contraception were discussed. The patient is undecided on Nexplanon vs IUD.   Edinburgh Postnatal Depression Scale - 05/22/21 1518       Edinburgh Postnatal Depression Scale:  In the Past 7 Days   I have been able to laugh and see the funny side of things. 0    I have looked forward with enjoyment to things. 0    I have blamed myself unnecessarily when things went wrong. 0    I have been anxious or worried for no good reason. 0    I have felt scared or panicky for no good reason. 0    Things have been getting on top of me. 0    I have been so unhappy that I have had difficulty sleeping. 0    I have felt sad or miserable. 0    I have been so unhappy that I have been crying. 0    The thought of harming myself has occurred to me. 0    Edinburgh Postnatal Depression Scale Total 0             Health Maintenance Due  Topic Date Due   COVID-19 Vaccine (1) Never done   HPV VACCINES (1 - 2-dose series) Never done   INFLUENZA VACCINE  02/24/2021       Review of Systems Pertinent items noted in HPI and remainder of comprehensive ROS otherwise negative.  Objective:  BP (!) 91/57   Pulse 73   Wt 125 lb 12.8 oz (57.1 kg)   BMI 19.70 kg/m    General:  alert, cooperative, and no distress   Breasts:  abnormal  bilateral breast engorgement, left breast tender and warm to touch  Lungs: clear to auscultation bilaterally  Heart:  regular rate and rhythm  Abdomen: soft, non-tender; bowel sounds normal; no masses,  no organomegaly   Wound N/a  GU exam:  not indicated       Assessment:    There are no diagnoses linked to this encounter.   postpartum exam.   Plan:   Essential components of care per ACOG recommendations:  1.  Mood and well being: Patient with negative depression screening today. Reviewed local resources for support.  - Patient tobacco use? No.   - hx of drug use? No.    2. Infant care and feeding:  -Patient currently breastmilk feeding? Yes. Reviewed importance of draining breast regularly to support lactation.  - Concern for left breast mastitis- rx provided -Social determinants of health (SDOH) reviewed in EPIC. No concerns  3. Sexuality, contraception and birth spacing - Patient does not want a pregnancy in the next year.   - Reviewed forms of contraception in tiered fashion. Patient undecided between IUD and Nexplanon today.   - Discussed birth spacing of 18 months - Patient plans to return in 2 weeks for contraception  and to follow up on breast mastitis  4. Sleep and fatigue -Encouraged family/partner/community support of 4 hrs of uninterrupted sleep to help with mood and fatigue  5. Physical Recovery  - Discussed patients delivery and complications. She describes her labor as good. - Patient has urinary incontinence? No. - Patient is not safe to resume physical and sexual activity  6.  Health Maintenance - HM due items addressed Yes - Last pap smear  Diagnosis  Date Value Ref Range Status  10/16/2020   Final   - Negative for intraepithelial lesion or malignancy (NILM)   Pap smear not done at today's visit.  -Breast Cancer screening indicated? No.   7. Chronic Disease/Pregnancy Condition follow up: None  - PCP follow up  Catalina Antigua, MD Center for  [facility], [facility]

## 2021-05-27 DIAGNOSIS — Z419 Encounter for procedure for purposes other than remedying health state, unspecified: Secondary | ICD-10-CM | POA: Diagnosis not present

## 2021-05-30 DIAGNOSIS — D235 Other benign neoplasm of skin of trunk: Secondary | ICD-10-CM | POA: Diagnosis not present

## 2021-06-05 ENCOUNTER — Other Ambulatory Visit: Payer: Self-pay

## 2021-06-05 ENCOUNTER — Ambulatory Visit (INDEPENDENT_AMBULATORY_CARE_PROVIDER_SITE_OTHER): Payer: Medicaid Other

## 2021-06-05 DIAGNOSIS — Z30013 Encounter for initial prescription of injectable contraceptive: Secondary | ICD-10-CM | POA: Diagnosis not present

## 2021-06-05 MED ORDER — MEDROXYPROGESTERONE ACETATE 150 MG/ML IM SUSP: 150.0000 mg | INTRAMUSCULAR | 4 refills | Status: DC

## 2021-06-05 MED ORDER — MEDROXYPROGESTERONE ACETATE 150 MG/ML IM SUSP
150.0000 mg | Freq: Once | INTRAMUSCULAR | Status: AC
  Administered 2021-06-05: 150 mg via INTRAMUSCULAR

## 2021-06-05 NOTE — Progress Notes (Signed)
Depo given RD without difficulty

## 2021-06-05 NOTE — Progress Notes (Signed)
Post Partum Visit Note  Charlotte Ward is a 20 y.o. G61P1102 female who presents for a postpartum visit. She is 6 weeks postpartum following a normal spontaneous vaginal delivery.  I have fully reviewed the prenatal and intrapartum course. The delivery was at [redacted]w[redacted]d gestational weeks.  Anesthesia: local. Postpartum course has been unmarkable. Baby is doing well. Baby is feeding by breast. Bleeding no bleeding. Bowel function is normal. Bladder function is normal. Patient is not sexually active. Contraception method is none. Postpartum depression screening: negative. EPDS=0  Reports breastfeeding is going well. She reports things have improved, but was initially having issues with latch. She expresses some concern with starting birth control, but desires to avoid pregnancy.  She receives support form her BF, mother, and grandmother.   The pregnancy intention screening data noted above was reviewed. Potential methods of contraception were discussed. The patient elected to proceed with No data recorded.   Edinburgh Postnatal Depression Scale - 06/05/21 1338       Edinburgh Postnatal Depression Scale:  In the Past 7 Days   I have been able to laugh and see the funny side of things. 0    I have looked forward with enjoyment to things. 0    I have blamed myself unnecessarily when things went wrong. 0    I have been anxious or worried for no good reason. 0    I have felt scared or panicky for no good reason. 0    Things have been getting on top of me. 0    I have been so unhappy that I have had difficulty sleeping. 0    I have felt sad or miserable. 0    I have been so unhappy that I have been crying. 0    The thought of harming myself has occurred to me. 0    Edinburgh Postnatal Depression Scale Total 0             Health Maintenance Due  Topic Date Due   COVID-19 Vaccine (1) Never done   HPV VACCINES (1 - 2-dose series) Never done   INFLUENZA VACCINE  02/24/2021    The  following portions of the patient's history were reviewed and updated as appropriate: allergies, current medications, past family history, past medical history, past social history, past surgical history, and problem list.  Review of Systems Pertinent items are noted in HPI.  Objective:  BP (!) 113/55   Pulse 62   Wt 125 lb (56.7 kg)   Breastfeeding Yes   BMI 19.58 kg/m    General:  alert, cooperative, and no distress   Breasts:  normal  Lungs: clear to auscultation bilaterally  Heart:  regular rate and rhythm  Abdomen: soft, non-tender; bowel sounds normal; no masses,  no organomegaly   Wound N/A  GU exam:  normal       Assessment:  4 Week Postpartum Exam Breastfeeding Desires Depo Provera Plan:   -Informed that lactation services available for outpatient support.  -Okay to return to normal activities and sexual activity. -Discussed usage of lubrication while breastfeeding. -Reviewed potential risks with Depo Provera injection.  -Reassured that she can try other methods as desired. -Further informed that she should avoid sexual activity for the first 7 days.  -RTO in 11-13 weeks for next Depo Provera injection -Encouraged to call or send mychart message with any questions or concerns.   Essential components of care per ACOG recommendations:  1.  Mood and well being: Patient  with negative depression screening today. Reviewed local resources for support.  - Patient tobacco use? No.   - hx of drug use? No.    2. Infant care and feeding:  -Patient currently breastmilk feeding? Yes. Discussed returning to work and pumping. Reviewed importance of draining breast regularly to support lactation.  -Social determinants of health (SDOH) reviewed in EPIC. No concerns-Access to food and transportation.   3. Sexuality, contraception and birth spacing - Patient does not want a pregnancy in the next year.  Desired family size is 2 children.  - Reviewed forms of contraception in  tiered fashion. Patient desired Depo-Provera today.   - Discussed birth spacing of 18 months  4. Sleep and fatigue-Endorses receiving adequate sleep daily. Sleeping at least 3-4 hours per night.  -Encouraged family/partner/community support of 4 hrs of uninterrupted sleep to help with mood and fatigue  5. Physical Recovery  - Discussed patients delivery and complications. She describes her labor as "okay until afterwards." She reports a PPH and manual extraction of some clots.  - Patient had a Vaginal problems after delivery including PPH . Patient had a 2nd degree episiotomy laceration. Perineal healing reviewed. Patient expressed understanding - Patient has urinary incontinence? No. - Patient is safe to resume physical and sexual activity  6.  Health Maintenance - HM due items addressed No - n/a - Last pap smear  Diagnosis  Date Value Ref Range Status  10/16/2020   Final   - Negative for intraepithelial lesion or malignancy (NILM)   Pap smear not done at today's visit.  -Breast Cancer screening indicated? No.   7. Chronic Disease/Pregnancy Condition follow up: None - PCP follow up  Cherre Robins, CNM Center for [employer], [facility]

## 2021-06-26 DIAGNOSIS — Z419 Encounter for procedure for purposes other than remedying health state, unspecified: Secondary | ICD-10-CM | POA: Diagnosis not present

## 2021-07-27 DIAGNOSIS — Z419 Encounter for procedure for purposes other than remedying health state, unspecified: Secondary | ICD-10-CM | POA: Diagnosis not present

## 2021-08-27 DIAGNOSIS — Z419 Encounter for procedure for purposes other than remedying health state, unspecified: Secondary | ICD-10-CM | POA: Diagnosis not present

## 2021-09-24 DIAGNOSIS — Z419 Encounter for procedure for purposes other than remedying health state, unspecified: Secondary | ICD-10-CM | POA: Diagnosis not present

## 2021-10-25 DIAGNOSIS — Z419 Encounter for procedure for purposes other than remedying health state, unspecified: Secondary | ICD-10-CM | POA: Diagnosis not present

## 2021-11-24 DIAGNOSIS — Z419 Encounter for procedure for purposes other than remedying health state, unspecified: Secondary | ICD-10-CM | POA: Diagnosis not present

## 2021-12-25 DIAGNOSIS — Z419 Encounter for procedure for purposes other than remedying health state, unspecified: Secondary | ICD-10-CM | POA: Diagnosis not present

## 2022-01-24 DIAGNOSIS — Z419 Encounter for procedure for purposes other than remedying health state, unspecified: Secondary | ICD-10-CM | POA: Diagnosis not present

## 2022-02-24 DIAGNOSIS — Z419 Encounter for procedure for purposes other than remedying health state, unspecified: Secondary | ICD-10-CM | POA: Diagnosis not present

## 2022-02-24 IMAGING — US US MFM OB FOLLOW-UP
1 series · 14 of 28 positions shown · non-contrast
Comparison: none

[Series 1: us mfm ob follow-up · 62 acquisitions, 14 frames shown]
[im 3/62]
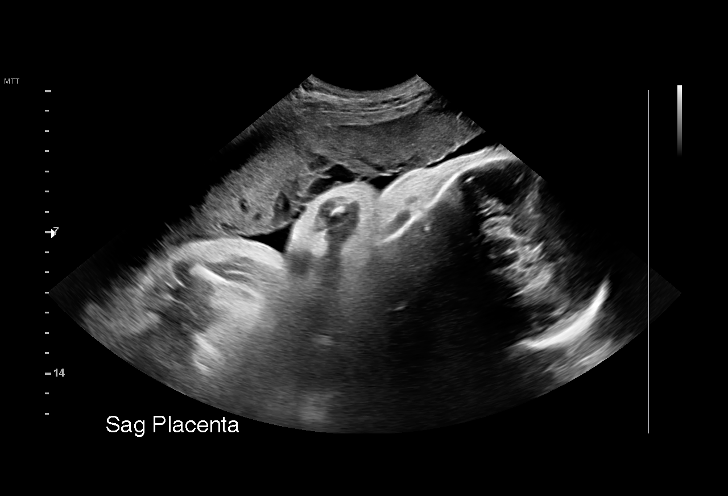
[im 7/62]
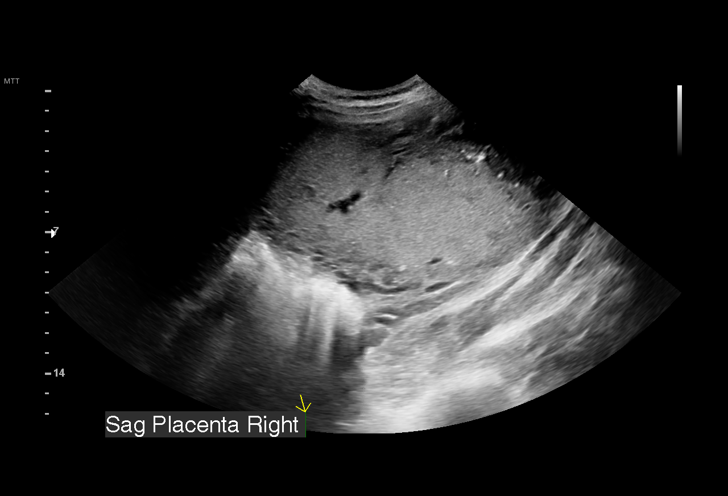
[im 12/62]
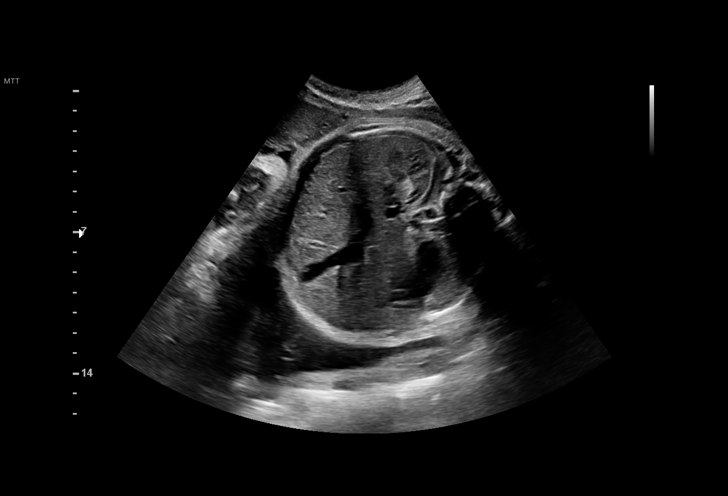
[im 16/62]
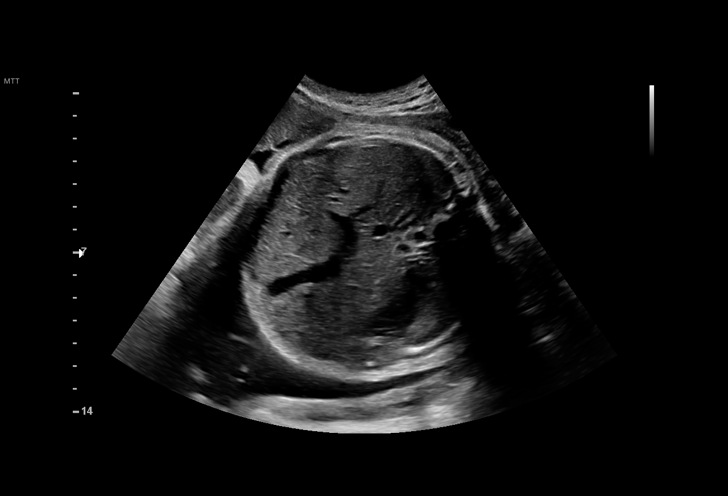
[im 21/62]
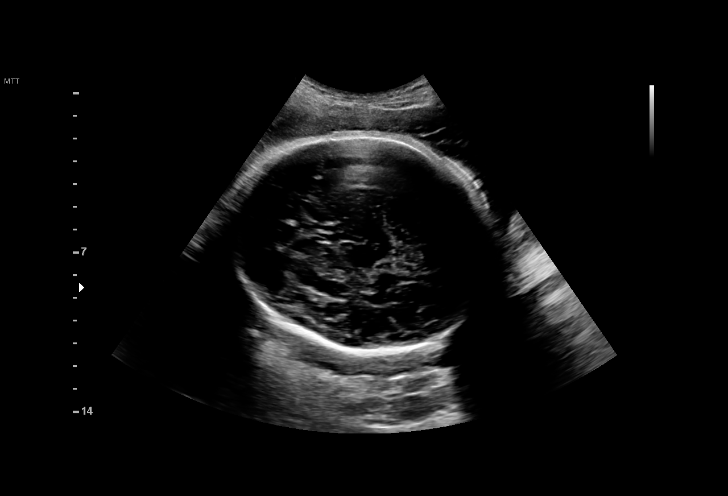
[im 25/62]
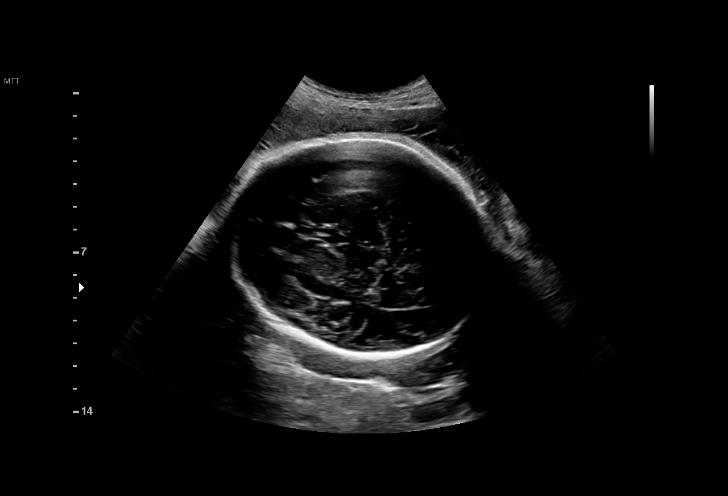
[im 30/62]
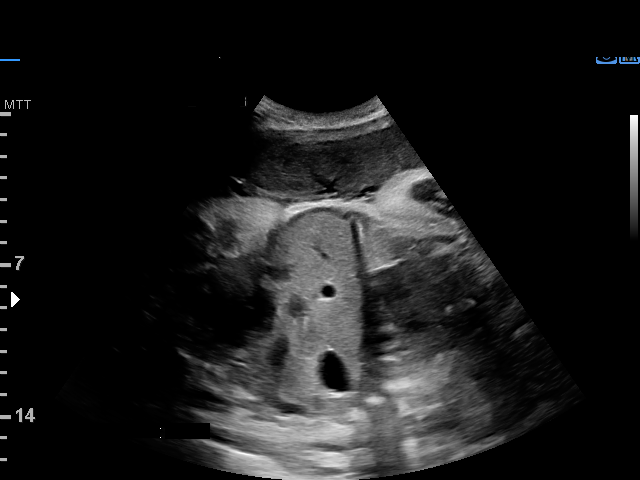
[im 34/62]
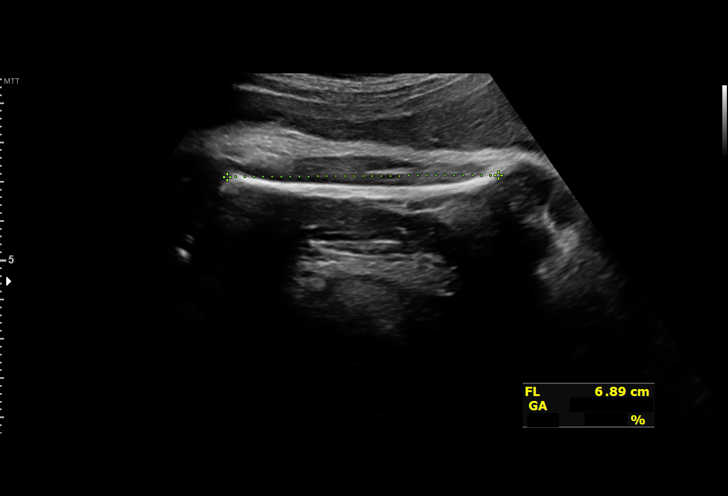
[im 39/62]
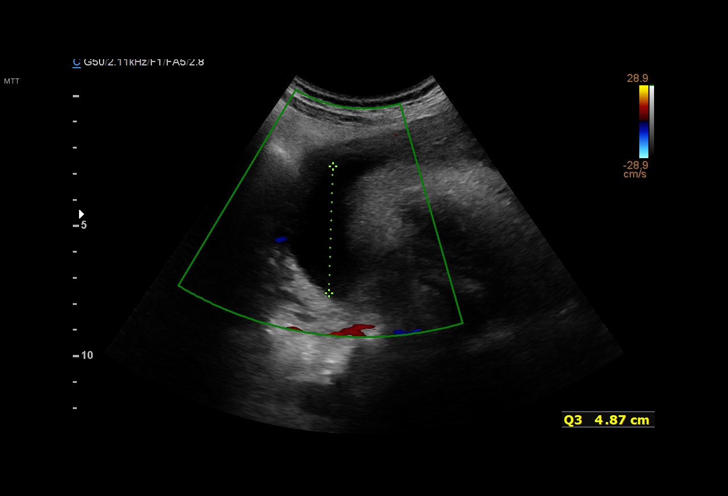
[im 43/62]
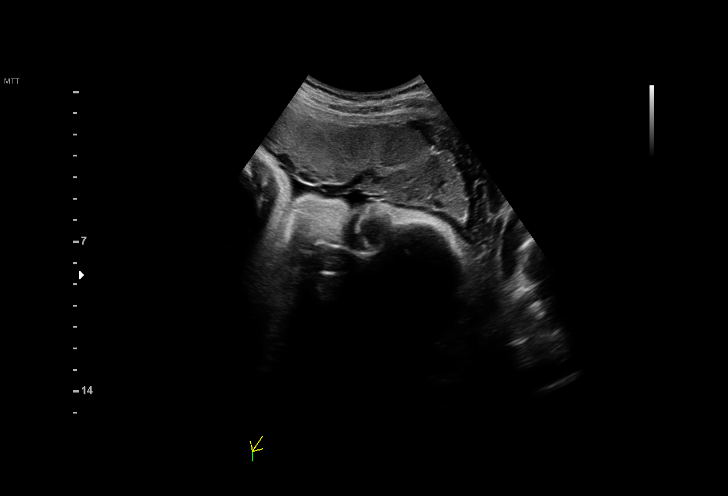
[im 48/62]
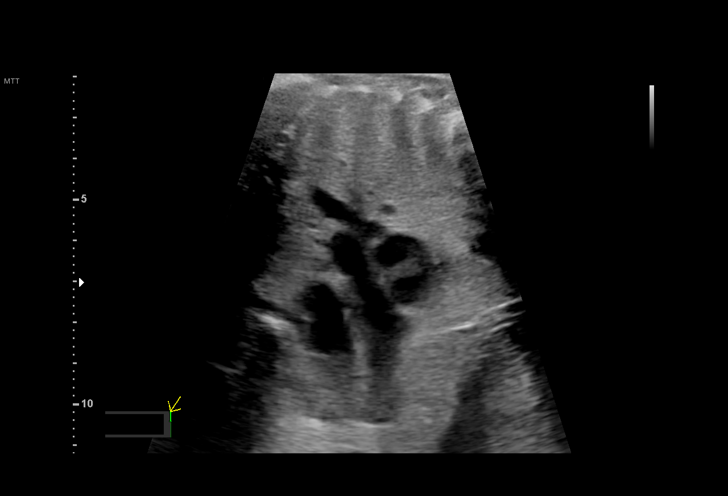
[im 52/62]
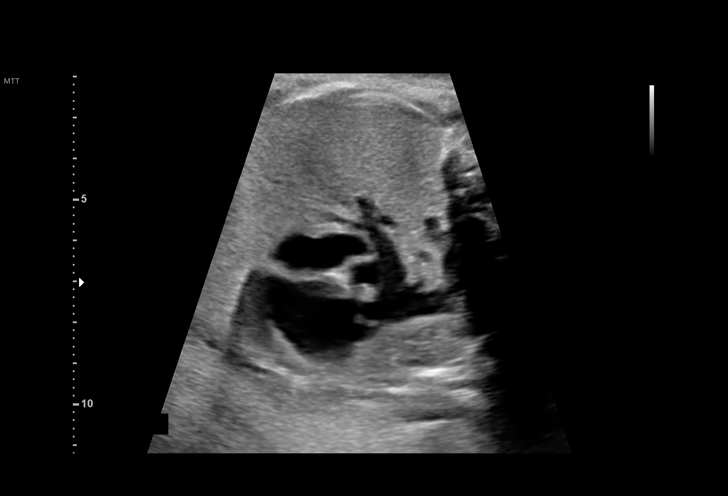
[im 57/62]
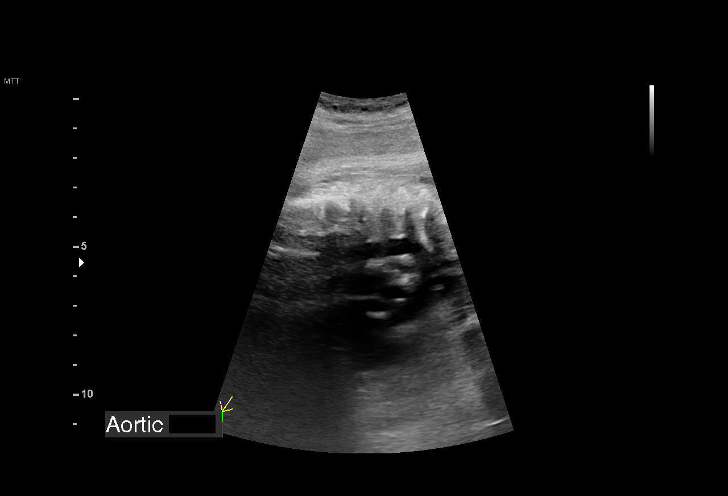
[im 62/62]
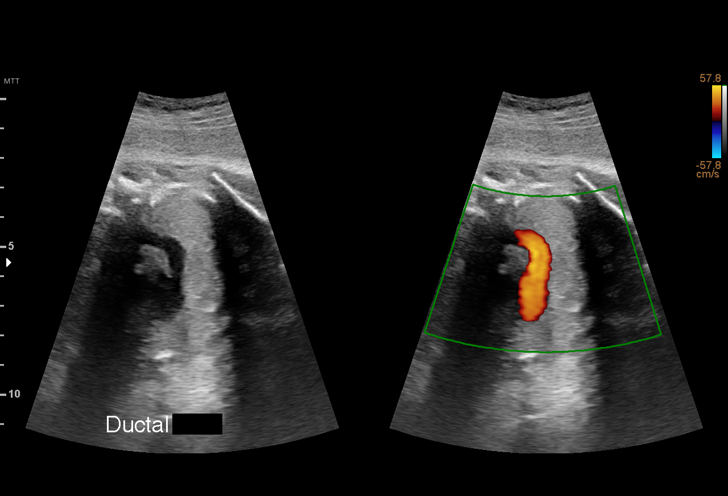

[14 of 28 positions shown; findings below may reference images not displayed]

Indications

 34 weeks gestation of pregnancy
 Encounter for other antenatal screening
 follow-up
 LR NIPS
 Uterine size-date discrepancy, third trimester
Fetal Evaluation

 Num Of Fetuses:         1
 Fetal Heart Rate(bpm):  152
 Cardiac Activity:       Observed
 Presentation:           Cephalic
 Placenta:               Anterior
 P. Cord Insertion:      Previously Visualized

 Amniotic Fluid
 AFI FV:      Within normal limits

 AFI Sum(cm)     %Tile       Largest Pocket(cm)
 18.4            68          6

 RUQ(cm)       RLQ(cm)       LUQ(cm)        LLQ(cm)
 6
Biometry

 BPD:      93.5  mm     G. Age:  38w 0d       > 99  %    CI:        77.06   %    70 - 86
                                                         FL/HC:      20.4   %    20.1 -
 HC:      337.3  mm     G. Age:  38w 5d         96  %    HC/AC:      0.93        0.93 -
 AC:      361.9  mm     G. Age:  40w 1d       > 99  %    FL/BPD:     73.7   %    71 - 87
 FL:       68.9  mm     G. Age:  35w 3d         63  %    FL/AC:      19.0   %    20 - 24
 CER:      45.7  mm     G. Age:  35w 1d         42  %

 Est. FW:    1974  gm    7 lb 14 oz    > 99  %
OB History

 Gravidity:    2         Term:   1        Prem:   0        SAB:   0
 TOP:          0       Ectopic:  0        Living: 1
Gestational Age

 LMP:           34w 4d        Date:  07/27/20                 EDD:   05/03/21
 U/S Today:     38w 1d                                        EDD:   04/08/21
 Best:          34w 4d     Det. By:  LMP  (07/27/20)          EDD:   05/03/21
Anatomy

 Cranium:               Appears normal         Aortic Arch:            Appears normal
 Cavum:                 Appears normal         Ductal Arch:            Appears normal
 Ventricles:            Appears normal         Diaphragm:              Appears normal
 Choroid Plexus:        Previously seen        Stomach:                Appears normal, left
                                                                       sided
 Cerebellum:            Appears normal         Abdomen:                Previously seen
 Posterior Fossa:       Previously seen        Abdominal Wall:         Previously seen
 Nuchal Fold:           Previously seen        Cord Vessels:           Previously seen
 Face:                  Orbits and profile     Kidneys:                Appear normal
                        previously seen
 Lips:                  Previously seen        Bladder:                Appears normal
 Thoracic:              Appears normal         Spine:                  Previously seen
 Heart:                 Appears normal         Upper Extremities:      Previously seen
                        (4CH, axis, and
                        situs)
 RVOT:                  Appears normal         Lower Extremities:      Previously seen
 LVOT:                  Appears normal

 Other:  Male gender previously seen. VC, 3VV, 3VTV, Nasal Bone, Heels Left
         5th digit visualized. Technically difficult due to advanced GA and fetal
         position.
Cervix Uterus Adnexa

 Cervix
 Not visualized (advanced GA >75wks)

 Uterus
 No abnormality visualized.

 Right Ovary
 Within normal limits.

 Left Ovary
 Within normal limits.

 Cul De Sac
 No free fluid seen.

 Adnexa
 No abnormality visualized.
Impression

 Follow up growth due to size and date discrepancy
 Normal interval growth with measurements consistent with
 large for gestational age with AC and EFW > 99%
 Good fetal movement and amniotic fluid volume

 I discussed today's findings with Ms. Mayumy. I recommend
 consideration for repeat growth in 4 weeks for delivery
 planning.
Recommendations

 Follow up growth in 4 weeks.

## 2022-03-27 DIAGNOSIS — Z419 Encounter for procedure for purposes other than remedying health state, unspecified: Secondary | ICD-10-CM | POA: Diagnosis not present

## 2022-04-26 DIAGNOSIS — Z419 Encounter for procedure for purposes other than remedying health state, unspecified: Secondary | ICD-10-CM | POA: Diagnosis not present

## 2022-05-27 DIAGNOSIS — Z419 Encounter for procedure for purposes other than remedying health state, unspecified: Secondary | ICD-10-CM | POA: Diagnosis not present

## 2022-06-26 DIAGNOSIS — Z419 Encounter for procedure for purposes other than remedying health state, unspecified: Secondary | ICD-10-CM | POA: Diagnosis not present

## 2022-07-27 DIAGNOSIS — Z419 Encounter for procedure for purposes other than remedying health state, unspecified: Secondary | ICD-10-CM | POA: Diagnosis not present

## 2022-08-27 DIAGNOSIS — Z419 Encounter for procedure for purposes other than remedying health state, unspecified: Secondary | ICD-10-CM | POA: Diagnosis not present

## 2022-09-24 ENCOUNTER — Ambulatory Visit
Admission: EM | Admit: 2022-09-24 | Discharge: 2022-09-24 | Disposition: A | Discharge status: Home / Self Care | Payer: Medicaid Other | Attending: Internal Medicine | Admitting: Internal Medicine

## 2022-09-24 ENCOUNTER — Ambulatory Visit (INDEPENDENT_AMBULATORY_CARE_PROVIDER_SITE_OTHER): Payer: Medicaid Other

## 2022-09-24 DIAGNOSIS — M545 Low back pain, unspecified: Secondary | ICD-10-CM | POA: Diagnosis not present

## 2022-09-24 DIAGNOSIS — M542 Cervicalgia: Secondary | ICD-10-CM

## 2022-09-24 MED ORDER — CYCLOBENZAPRINE HCL 5 MG PO TABS: 5.0000 mg | ORAL_TABLET | Freq: Two times a day (BID) | ORAL | 0 refills | Status: AC | PRN
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
## 2022-09-25 DIAGNOSIS — Z419 Encounter for procedure for purposes other than remedying health state, unspecified: Secondary | ICD-10-CM | POA: Diagnosis not present

## 2022-10-26 DIAGNOSIS — Z419 Encounter for procedure for purposes other than remedying health state, unspecified: Secondary | ICD-10-CM | POA: Diagnosis not present

## 2022-11-24 ENCOUNTER — Other Ambulatory Visit (HOSPITAL_COMMUNITY)
Admission: RE | Admit: 2022-11-24 | Discharge: 2022-11-24 | Disposition: A | Discharge status: Home / Self Care | Payer: Medicaid Other | Source: Ambulatory Visit | Attending: Obstetrics and Gynecology | Admitting: Obstetrics and Gynecology

## 2022-11-24 ENCOUNTER — Ambulatory Visit (INDEPENDENT_AMBULATORY_CARE_PROVIDER_SITE_OTHER): Payer: Medicaid Other

## 2022-11-24 VITALS — BP 99/62 | HR 90 | Wt 124.5 lb

## 2022-11-24 DIAGNOSIS — N898 Other specified noninflammatory disorders of vagina: Secondary | ICD-10-CM | POA: Diagnosis not present

## 2022-11-24 DIAGNOSIS — Z113 Encounter for screening for infections with a predominantly sexual mode of transmission: Secondary | ICD-10-CM

## 2022-11-24 NOTE — Progress Notes (Signed)
SUBJECTIVE:  22 y.o. female complains of yellow vaginal discharge for 1 week. She requests all STD testing.  Denies abnormal vaginal bleeding or significant pelvic pain or fever. No UTI symptoms.  Patient's last menstrual period was 11/17/2022.  OBJECTIVE:  She appears well, afebrile. Urine dipstick: not done.  ASSESSMENT:  Vaginal Discharge  Vaginal Odor   PLAN:  GC, chlamydia, trichomonas, BVAG, CVAG probe sent to lab. Treatment: To be determined once lab results are received ROV prn if symptoms persist or worsen.

## 2022-11-25 DIAGNOSIS — Z419 Encounter for procedure for purposes other than remedying health state, unspecified: Secondary | ICD-10-CM | POA: Diagnosis not present

## 2022-11-25 LAB — CERVICOVAGINAL ANCILLARY ONLY
Bacterial Vaginitis (gardnerella): POSITIVE — AB
Candida Glabrata: NEGATIVE
Candida Vaginitis: NEGATIVE
Chlamydia: NEGATIVE
Comment: NEGATIVE
Comment: NEGATIVE
Comment: NEGATIVE
Comment: NEGATIVE
Comment: NEGATIVE
Comment: NORMAL
Neisseria Gonorrhea: NEGATIVE
Trichomonas: NEGATIVE

## 2022-11-25 LAB — HIV ANTIBODY (ROUTINE TESTING W REFLEX): HIV Screen 4th Generation wRfx: NONREACTIVE

## 2022-11-25 LAB — HEPATITIS C ANTIBODY: Hep C Virus Ab: NONREACTIVE

## 2022-11-25 LAB — RPR: RPR Ser Ql: NONREACTIVE

## 2022-11-25 LAB — HEPATITIS B SURFACE ANTIGEN: Hepatitis B Surface Ag: NEGATIVE

## 2022-11-26 ENCOUNTER — Other Ambulatory Visit: Payer: Self-pay

## 2022-11-26 DIAGNOSIS — N760 Acute vaginitis: Secondary | ICD-10-CM

## 2022-11-26 MED ORDER — METRONIDAZOLE 500 MG PO TABS
500.0000 mg | ORAL_TABLET | Freq: Two times a day (BID) | ORAL | 0 refills | Status: AC
Start: 2022-11-26 — End: ?

## 2022-12-26 DIAGNOSIS — Z419 Encounter for procedure for purposes other than remedying health state, unspecified: Secondary | ICD-10-CM | POA: Diagnosis not present

## 2023-01-04 ENCOUNTER — Ambulatory Visit (INDEPENDENT_AMBULATORY_CARE_PROVIDER_SITE_OTHER): Payer: Medicaid Other | Admitting: Obstetrics

## 2023-01-04 ENCOUNTER — Encounter: Payer: Self-pay | Admitting: Obstetrics

## 2023-01-04 ENCOUNTER — Other Ambulatory Visit (HOSPITAL_COMMUNITY)
Admission: RE | Admit: 2023-01-04 | Discharge: 2023-01-04 | Disposition: A | Discharge status: Home / Self Care | Payer: Medicaid Other | Source: Ambulatory Visit | Attending: Obstetrics | Admitting: Obstetrics

## 2023-01-04 VITALS — BP 100/61 | HR 76 | Ht 67.0 in | Wt 116.0 lb

## 2023-01-04 DIAGNOSIS — D508 Other iron deficiency anemias: Secondary | ICD-10-CM

## 2023-01-04 DIAGNOSIS — Z01419 Encounter for gynecological examination (general) (routine) without abnormal findings: Secondary | ICD-10-CM | POA: Diagnosis not present

## 2023-01-04 DIAGNOSIS — N898 Other specified noninflammatory disorders of vagina: Secondary | ICD-10-CM | POA: Insufficient documentation

## 2023-01-04 DIAGNOSIS — Z1339 Encounter for screening examination for other mental health and behavioral disorders: Secondary | ICD-10-CM

## 2023-01-04 DIAGNOSIS — Z3041 Encounter for surveillance of contraceptive pills: Secondary | ICD-10-CM

## 2023-01-04 MED ORDER — SPRINTEC 28 0.25-35 MG-MCG PO TABS
1.0000 | ORAL_TABLET | Freq: Every day | ORAL | 12 refills | Status: DC
Start: 2023-01-04 — End: 2024-01-05

## 2023-01-04 MED ORDER — VITAFOL ULTRA 29-0.6-0.4-200 MG PO CAPS
1.0000 | ORAL_CAPSULE | Freq: Every day | ORAL | 4 refills | Status: AC
Start: 2023-01-04 — End: ?

## 2023-01-04 NOTE — Progress Notes (Signed)
Subjective:        Charlotte Ward is a 22 y.o. female here for a routine exam.  Current complaints: Vaginal discharge.    Personal health questionnaire:  Is patient Charlotte Ward, have a family history of breast and/or ovarian cancer: no Is there a family history of uterine cancer diagnosed at age < 52, gastrointestinal cancer, urinary tract cancer, family member who is a Personnel officer syndrome-associated carrier: no Is the patient overweight and hypertensive, family history of diabetes, personal history of gestational diabetes, preeclampsia or PCOS: no Is patient over 67, have PCOS,  family history of premature CHD under age 23, diabetes, smoke, have hypertension or peripheral artery disease:  no At any time, has a partner hit, kicked or otherwise hurt or frightened you?: no Over the past 2 weeks, have you felt down, depressed or hopeless?: no Over the past 2 weeks, have you felt little interest or pleasure in doing things?:no   Gynecologic History Patient's last menstrual period was 12/31/2022 (approximate). Contraception: OCP (estrogen/progesterone) Last Pap: 2022. Results were: normal Last mammogram: n/a. Results were: n/a  Obstetric History OB History  Gravida Para Term Preterm AB Living  2 2 1 1   2   SAB IAB Ectopic Multiple Live Births        0 2    # Outcome Date GA Lbr Len/2nd Weight Sex Delivery Anes PTL Lv  2 Term 04/28/21 [redacted]w[redacted]d / 00:22 9 lb 4.5 oz (4.21 kg) M Vag-Spont Local  LIV  1 Preterm 01/22/19 [redacted]w[redacted]d 02:02 / 00:27 6 lb 9.8 oz (3 kg) F Vag-Spont Local  LIV    Past Medical History:  Diagnosis Date   Anxiety    Supervision of normal first teen pregnancy in second trimester 09/26/2018   BabyRx 3/30--BP cuff given from office (left at desk for P/U) Nursing Staff Provider Office Location  [place] Dating   LMP: 05/09/2018 Language   English Anatomy US  Wnl Flu Vaccine   09/26/2018 Genetic Screen  NIPS: low risk female   AFP:   TDaP vaccine   info given 11-21-18 Hgb  A1C or  GTT Early  Third trimester : 2 hour wnl  Component     Latest Ref Rng & Units 11/21/2018 Glucose, Fasting     65 -    No past surgical history on file.   Current Outpatient Medications:    Prenat-Fe Poly-Methfol-FA-DHA (VITAFOL ULTRA) 29-0.6-0.4-200 MG CAPS, Take 1 capsule by mouth daily before breakfast., Disp: 90 capsule, Rfl: 4   acetaminophen (TYLENOL) 325 MG tablet, Take 2 tablets (650 mg total) by mouth every 4 (four) hours as needed for mild pain or moderate pain. (Patient not taking: Reported on 06/05/2021), Disp: 90 tablet, Rfl:    cyclobenzaprine (FLEXERIL) 5 MG tablet, Take 1 tablet (5 mg total) by mouth 2 (two) times daily as needed for muscle spasms. (Patient not taking: Reported on 11/24/2022), Disp: 20 tablet, Rfl: 0   ferrous sulfate 325 (65 FE) MG tablet, Take 1 tablet (325 mg total) by mouth every other day. (Patient not taking: Reported on 06/05/2021), Disp: 60 tablet, Rfl: 0   ibuprofen (ADVIL) 200 MG tablet, Take 3 tablets (600 mg total) by mouth every 6 (six) hours as needed for mild pain or moderate pain. (Patient not taking: Reported on 11/24/2022), Disp: 90 tablet, Rfl:    metroNIDAZOLE (FLAGYL) 500 MG tablet, Take 1 tablet (500 mg total) by mouth 2 (two) times daily., Disp: 14 tablet, Rfl: 0   metroNIDAZOLE (FLAGYL) 500  MG tablet, Take 1 tablet (500 mg total) by mouth 2 (two) times daily., Disp: 14 tablet, Rfl: 0   SPRINTEC 28 0.25-35 MG-MCG tablet, Take 1 tablet by mouth daily., Disp: 28 tablet, Rfl: 12 Allergies  Allergen Reactions   Tomato Swelling    Patient reports only allergic to RAW tomatoes.    Social History   Tobacco Use   Smoking status: Former    Packs/day: .25    Types: Cigars, Cigarettes    Quit date: 10/08/2016    Years since quitting: 6.2    Passive exposure: Never   Smokeless tobacco: Current  Substance Use Topics   Alcohol use: No    Family History  Problem Relation Age of Onset   Asthma Mother    Migraines Mother       Review of  Systems  Constitutional: negative for fatigue and weight loss Respiratory: negative for cough and wheezing Cardiovascular: negative for chest pain, fatigue and palpitations Gastrointestinal: negative for abdominal pain and change in bowel habits Musculoskeletal:negative for myalgias Neurological: negative for gait problems and tremors Behavioral/Psych: negative for abusive relationship, depression Endocrine: negative for temperature intolerance    Genitourinary: positive for vaginal discharge.  negative for abnormal menstrual periods, genital lesions, hot flashes, sexual problems  Integument/breast: negative for breast lump, breast tenderness, nipple discharge and skin lesion(s)    Objective:       BP 100/61   Pulse 76   Ht 5\' 7"  (1.702 m)   Wt 116 lb (52.6 kg)   LMP 12/31/2022 (Approximate)   BMI 18.17 kg/m  General:   Alert and no distress  Skin:   no rash or abnormalities  Lungs:   clear to auscultation bilaterally  Heart:   regular rate and rhythm, S1, S2 normal, no murmur, click, rub or gallop  Breasts:   normal without suspicious masses, skin or nipple changes or axillary nodes  Abdomen:  normal findings: no organomegaly, soft, non-tender and no hernia  Pelvis:  External genitalia: normal general appearance Urinary system: urethral meatus normal and bladder without fullness, nontender Vaginal: normal without tenderness, induration or masses Cervix: normal appearance Adnexa: normal bimanual exam Uterus: anteverted and non-tender, normal size   Lab Review Urine pregnancy test Labs reviewed yes Radiologic studies reviewed no  I have spent a total of 20 minutes of face-to-face time, excluding clinical staff time, reviewing notes and preparing to see patient, ordering tests and/or medications, and counseling the patient.   Assessment:    1. Encounter for gynecological examination with Papanicolaou smear of cervix Rx: - Cytology - PAP( [place])  2. Vaginal  discharge Rx: - Cervicovaginal ancillary only( [place])  3. Encounter for surveillance of contraceptive pills Rx: - SPRINTEC 28 0.25-35 MG-MCG tablet; Take 1 tablet by mouth daily.  Dispense: 28 tablet; Refill: 12  4. Iron deficiency anemia secondary to inadequate dietary iron intake Rx: - Prenat-Fe Poly-Methfol-FA-DHA (VITAFOL ULTRA) 29-0.6-0.4-200 MG CAPS; Take 1 capsule by mouth daily before breakfast.  Dispense: 90 capsule; Refill: 4     Plan:    Education reviewed: calcium supplements, depression evaluation, low fat, low cholesterol diet, safe sex/STD prevention, self breast exams, and weight bearing exercise. Contraception: OCP (estrogen/progesterone). Follow up in: 1 year.   Meds ordered this encounter  Medications   SPRINTEC 28 0.25-35 MG-MCG tablet    Sig: Take 1 tablet by mouth daily.    Dispense:  28 tablet    Refill:  12   Prenat-Fe Poly-Methfol-FA-DHA (VITAFOL ULTRA) 29-0.6-0.4-200 MG CAPS  Sig: Take 1 capsule by mouth daily before breakfast.    Dispense:  90 capsule    Refill:  4    Brock Bad, MD 01/09/2023 3:07 PM

## 2023-01-06 ENCOUNTER — Other Ambulatory Visit: Payer: Self-pay | Admitting: Obstetrics

## 2023-01-06 LAB — CERVICOVAGINAL ANCILLARY ONLY
Bacterial Vaginitis (gardnerella): POSITIVE — AB
Candida Glabrata: NEGATIVE
Candida Vaginitis: NEGATIVE
Chlamydia: NEGATIVE
Comment: NEGATIVE
Comment: NEGATIVE
Comment: NEGATIVE
Comment: NEGATIVE
Comment: NEGATIVE
Comment: NORMAL
Neisseria Gonorrhea: NEGATIVE
Trichomonas: NEGATIVE

## 2023-01-06 LAB — CYTOLOGY - PAP
Comment: NEGATIVE
Diagnosis: NEGATIVE
Diagnosis: REACTIVE
High risk HPV: NEGATIVE

## 2023-01-07 ENCOUNTER — Other Ambulatory Visit: Payer: Self-pay

## 2023-01-07 DIAGNOSIS — B9689 Other specified bacterial agents as the cause of diseases classified elsewhere: Secondary | ICD-10-CM

## 2023-01-07 MED ORDER — METRONIDAZOLE 500 MG PO TABS
500.0000 mg | ORAL_TABLET | Freq: Two times a day (BID) | ORAL | 0 refills | Status: AC
Start: 2023-01-07 — End: ?

## 2023-01-25 DIAGNOSIS — Z419 Encounter for procedure for purposes other than remedying health state, unspecified: Secondary | ICD-10-CM | POA: Diagnosis not present

## 2023-02-25 DIAGNOSIS — Z419 Encounter for procedure for purposes other than remedying health state, unspecified: Secondary | ICD-10-CM | POA: Diagnosis not present

## 2023-03-28 DIAGNOSIS — Z419 Encounter for procedure for purposes other than remedying health state, unspecified: Secondary | ICD-10-CM | POA: Diagnosis not present

## 2023-04-27 DIAGNOSIS — Z419 Encounter for procedure for purposes other than remedying health state, unspecified: Secondary | ICD-10-CM | POA: Diagnosis not present

## 2023-05-28 DIAGNOSIS — Z419 Encounter for procedure for purposes other than remedying health state, unspecified: Secondary | ICD-10-CM | POA: Diagnosis not present

## 2023-06-22 DIAGNOSIS — M940 Chondrocostal junction syndrome [Tietze]: Secondary | ICD-10-CM | POA: Diagnosis not present

## 2023-06-27 DIAGNOSIS — Z419 Encounter for procedure for purposes other than remedying health state, unspecified: Secondary | ICD-10-CM | POA: Diagnosis not present

## 2023-07-28 DIAGNOSIS — Z419 Encounter for procedure for purposes other than remedying health state, unspecified: Secondary | ICD-10-CM | POA: Diagnosis not present

## 2023-08-06 DIAGNOSIS — Z23 Encounter for immunization: Secondary | ICD-10-CM | POA: Diagnosis not present

## 2023-08-06 DIAGNOSIS — Z111 Encounter for screening for respiratory tuberculosis: Secondary | ICD-10-CM | POA: Diagnosis not present

## 2023-08-08 DIAGNOSIS — Z111 Encounter for screening for respiratory tuberculosis: Secondary | ICD-10-CM | POA: Diagnosis not present

## 2023-08-28 DIAGNOSIS — Z419 Encounter for procedure for purposes other than remedying health state, unspecified: Secondary | ICD-10-CM | POA: Diagnosis not present

## 2023-09-25 DIAGNOSIS — Z419 Encounter for procedure for purposes other than remedying health state, unspecified: Secondary | ICD-10-CM | POA: Diagnosis not present

## 2023-10-05 DIAGNOSIS — H5712 Ocular pain, left eye: Secondary | ICD-10-CM | POA: Diagnosis not present

## 2023-10-05 DIAGNOSIS — H00024 Hordeolum internum left upper eyelid: Secondary | ICD-10-CM | POA: Diagnosis not present

## 2023-10-07 DIAGNOSIS — H5789 Other specified disorders of eye and adnexa: Secondary | ICD-10-CM | POA: Diagnosis not present

## 2023-10-07 DIAGNOSIS — H00034 Abscess of left upper eyelid: Secondary | ICD-10-CM | POA: Diagnosis not present

## 2023-10-07 DIAGNOSIS — R600 Localized edema: Secondary | ICD-10-CM | POA: Diagnosis not present

## 2023-11-06 DIAGNOSIS — Z419 Encounter for procedure for purposes other than remedying health state, unspecified: Secondary | ICD-10-CM | POA: Diagnosis not present

## 2023-12-03 DIAGNOSIS — J029 Acute pharyngitis, unspecified: Secondary | ICD-10-CM | POA: Diagnosis not present

## 2023-12-03 DIAGNOSIS — J209 Acute bronchitis, unspecified: Secondary | ICD-10-CM | POA: Diagnosis not present

## 2023-12-03 DIAGNOSIS — J069 Acute upper respiratory infection, unspecified: Secondary | ICD-10-CM | POA: Diagnosis not present

## 2023-12-06 DIAGNOSIS — Z419 Encounter for procedure for purposes other than remedying health state, unspecified: Secondary | ICD-10-CM | POA: Diagnosis not present

## 2024-01-04 ENCOUNTER — Other Ambulatory Visit: Payer: Self-pay | Admitting: Obstetrics

## 2024-01-04 DIAGNOSIS — Z3041 Encounter for surveillance of contraceptive pills: Secondary | ICD-10-CM

## 2024-01-06 DIAGNOSIS — Z419 Encounter for procedure for purposes other than remedying health state, unspecified: Secondary | ICD-10-CM | POA: Diagnosis not present

## 2024-02-05 DIAGNOSIS — Z419 Encounter for procedure for purposes other than remedying health state, unspecified: Secondary | ICD-10-CM | POA: Diagnosis not present

## 2024-03-07 DIAGNOSIS — Z419 Encounter for procedure for purposes other than remedying health state, unspecified: Secondary | ICD-10-CM | POA: Diagnosis not present

## 2024-04-07 DIAGNOSIS — Z419 Encounter for procedure for purposes other than remedying health state, unspecified: Secondary | ICD-10-CM | POA: Diagnosis not present

## 2024-05-07 DIAGNOSIS — Z419 Encounter for procedure for purposes other than remedying health state, unspecified: Secondary | ICD-10-CM | POA: Diagnosis not present
